# Patient Record
Sex: Female | Born: 1980 | Race: Black or African American | Hispanic: No | Marital: Married | Smoking: Never smoker
Health system: Southern US, Community
[De-identification: ages and names within clinical notes are randomized; demographics above are authoritative.]

## PROBLEM LIST (undated history)

## (undated) ENCOUNTER — Inpatient Hospital Stay (HOSPITAL_COMMUNITY): Payer: Self-pay

## (undated) DIAGNOSIS — D649 Anemia, unspecified: Secondary | ICD-10-CM

## (undated) DIAGNOSIS — Z9622 Myringotomy tube(s) status: Secondary | ICD-10-CM

## (undated) DIAGNOSIS — O0930 Supervision of pregnancy with insufficient antenatal care, unspecified trimester: Secondary | ICD-10-CM

## (undated) DIAGNOSIS — R87619 Unspecified abnormal cytological findings in specimens from cervix uteri: Secondary | ICD-10-CM

## (undated) DIAGNOSIS — O3410 Maternal care for benign tumor of corpus uteri, unspecified trimester: Secondary | ICD-10-CM

## (undated) DIAGNOSIS — D259 Leiomyoma of uterus, unspecified: Secondary | ICD-10-CM

## (undated) DIAGNOSIS — IMO0002 Reserved for concepts with insufficient information to code with codable children: Secondary | ICD-10-CM

## (undated) HISTORY — PX: COLPOSCOPY: SHX161

## (undated) HISTORY — DX: Unspecified abnormal cytological findings in specimens from cervix uteri: R87.619

## (undated) HISTORY — DX: Supervision of pregnancy with insufficient antenatal care, unspecified trimester: O09.30

## (undated) HISTORY — DX: Maternal care for benign tumor of corpus uteri, unspecified trimester: D25.9

## (undated) HISTORY — DX: Reserved for concepts with insufficient information to code with codable children: IMO0002

## (undated) HISTORY — DX: Anemia, unspecified: D64.9

## (undated) HISTORY — PX: DILATION AND CURETTAGE OF UTERUS: SHX78

## (undated) HISTORY — DX: Myringotomy tube(s) status: Z96.22

## (undated) HISTORY — PX: OTHER SURGICAL HISTORY: SHX169

## (undated) HISTORY — DX: Maternal care for benign tumor of corpus uteri, unspecified trimester: O34.10

---
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
## 2003-05-06 ENCOUNTER — Emergency Department (HOSPITAL_COMMUNITY): Admission: EM | Admit: 2003-05-06 | Discharge: 2003-05-06 | Payer: Self-pay | Admitting: Emergency Medicine

## 2003-07-26 ENCOUNTER — Emergency Department (HOSPITAL_COMMUNITY): Admission: EM | Admit: 2003-07-26 | Discharge: 2003-07-26 | Payer: Self-pay

## 2003-12-17 ENCOUNTER — Emergency Department (HOSPITAL_COMMUNITY): Admission: EM | Admit: 2003-12-17 | Discharge: 2003-12-17 | Payer: Self-pay | Admitting: *Deleted

## 2004-03-01 ENCOUNTER — Emergency Department (HOSPITAL_COMMUNITY): Admission: EM | Admit: 2004-03-01 | Discharge: 2004-03-02 | Payer: Self-pay | Admitting: *Deleted

## 2004-03-19 ENCOUNTER — Ambulatory Visit (HOSPITAL_COMMUNITY): Admission: RE | Admit: 2004-03-19 | Discharge: 2004-03-19 | Payer: Self-pay | Admitting: Obstetrics and Gynecology

## 2004-04-07 ENCOUNTER — Inpatient Hospital Stay (HOSPITAL_COMMUNITY): Admission: AD | Admit: 2004-04-07 | Discharge: 2004-04-07 | Payer: Self-pay | Admitting: *Deleted

## 2004-05-15 ENCOUNTER — Inpatient Hospital Stay (HOSPITAL_COMMUNITY): Admission: AD | Admit: 2004-05-15 | Discharge: 2004-05-15 | Payer: Self-pay | Admitting: Obstetrics and Gynecology

## 2004-06-18 ENCOUNTER — Inpatient Hospital Stay (HOSPITAL_COMMUNITY): Admission: AD | Admit: 2004-06-18 | Discharge: 2004-06-18 | Payer: Self-pay | Admitting: Family Medicine

## 2004-06-25 ENCOUNTER — Inpatient Hospital Stay (HOSPITAL_COMMUNITY): Admission: AD | Admit: 2004-06-25 | Discharge: 2004-06-25 | Payer: Self-pay | Admitting: Obstetrics and Gynecology

## 2004-07-12 ENCOUNTER — Emergency Department (HOSPITAL_COMMUNITY): Admission: EM | Admit: 2004-07-12 | Discharge: 2004-07-12 | Payer: Self-pay | Admitting: Emergency Medicine

## 2004-08-13 ENCOUNTER — Inpatient Hospital Stay (HOSPITAL_COMMUNITY): Admission: AD | Admit: 2004-08-13 | Discharge: 2004-08-13 | Payer: Self-pay | Admitting: Obstetrics & Gynecology

## 2004-08-24 ENCOUNTER — Ambulatory Visit: Payer: Self-pay | Admitting: Obstetrics and Gynecology

## 2004-08-28 ENCOUNTER — Ambulatory Visit: Payer: Self-pay | Admitting: Family Medicine

## 2004-08-28 ENCOUNTER — Inpatient Hospital Stay (HOSPITAL_COMMUNITY): Admission: AD | Admit: 2004-08-28 | Discharge: 2004-08-28 | Payer: Self-pay | Admitting: *Deleted

## 2004-08-29 ENCOUNTER — Inpatient Hospital Stay (HOSPITAL_COMMUNITY): Admission: AD | Admit: 2004-08-29 | Discharge: 2004-08-31 | Payer: Self-pay | Admitting: *Deleted

## 2005-01-11 ENCOUNTER — Inpatient Hospital Stay (HOSPITAL_COMMUNITY): Admission: AD | Admit: 2005-01-11 | Discharge: 2005-01-11 | Payer: Self-pay | Admitting: *Deleted

## 2005-04-26 ENCOUNTER — Emergency Department (HOSPITAL_COMMUNITY): Admission: EM | Admit: 2005-04-26 | Discharge: 2005-04-26 | Payer: Self-pay | Admitting: Emergency Medicine

## 2005-05-01 ENCOUNTER — Emergency Department (HOSPITAL_COMMUNITY): Admission: EM | Admit: 2005-05-01 | Discharge: 2005-05-01 | Payer: Self-pay | Admitting: Emergency Medicine

## 2005-06-15 IMAGING — US US PELVIS COMPLETE MODIFY
1 series · 14 of 25 positions shown · non-contrast
Comparison: none

CLINICAL DATA: Low abdominal and pelvis pain. 
 PELVIC AND TRANSVAGINAL ULTRASOUND

[Series 1: 02 · 0.27mm/px · 14 of 40 slices shown]
[im 1/40]
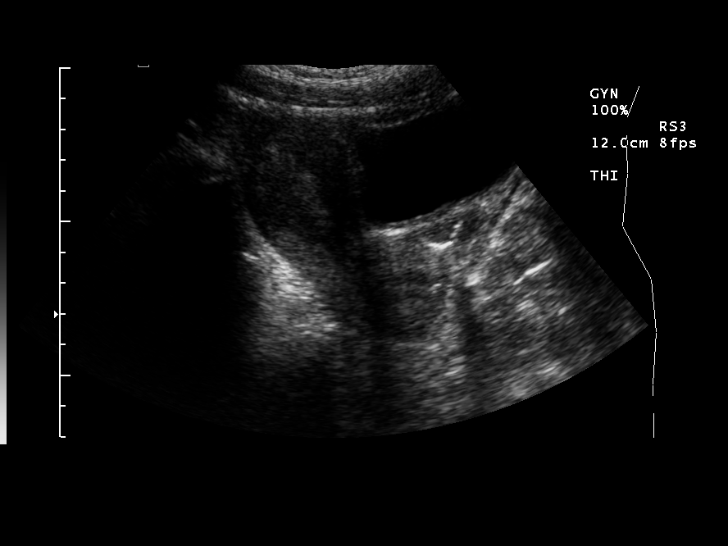
[im 4/40]
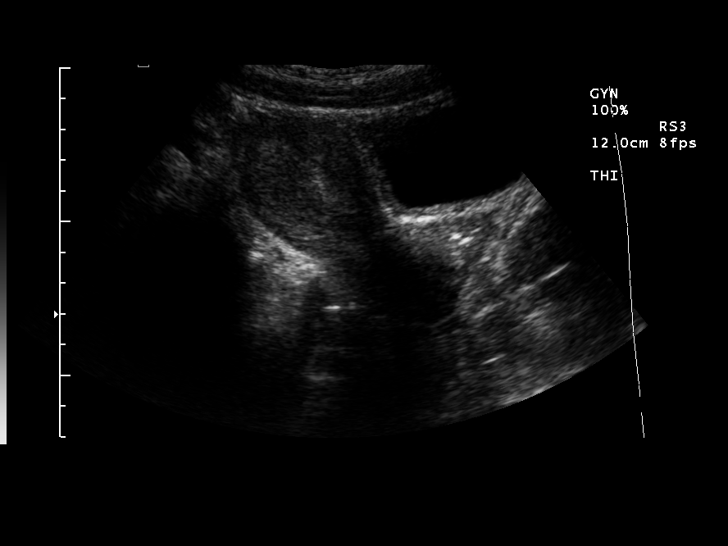
[im 7/40]
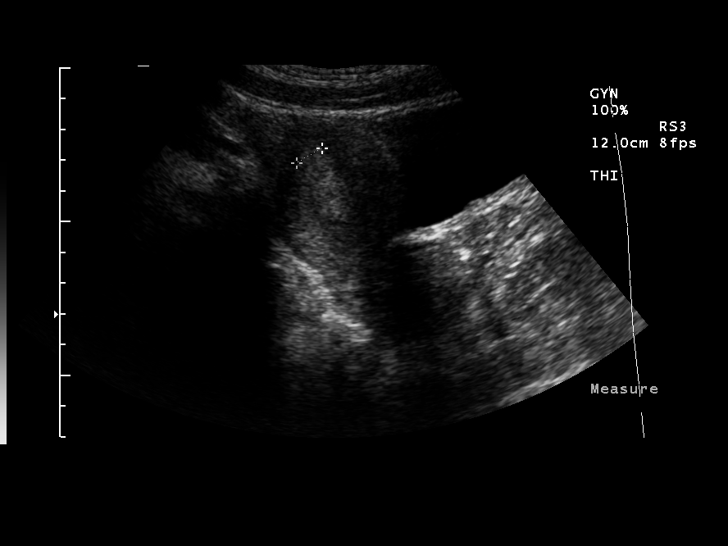
[im 10/40]
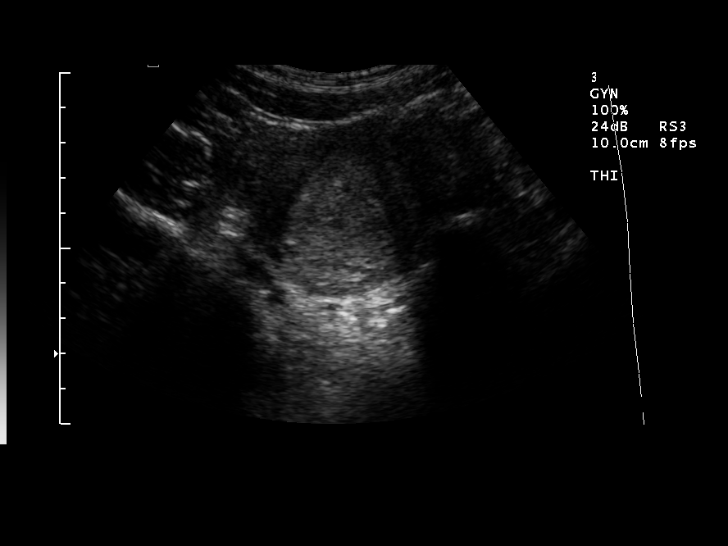
[im 14/40]
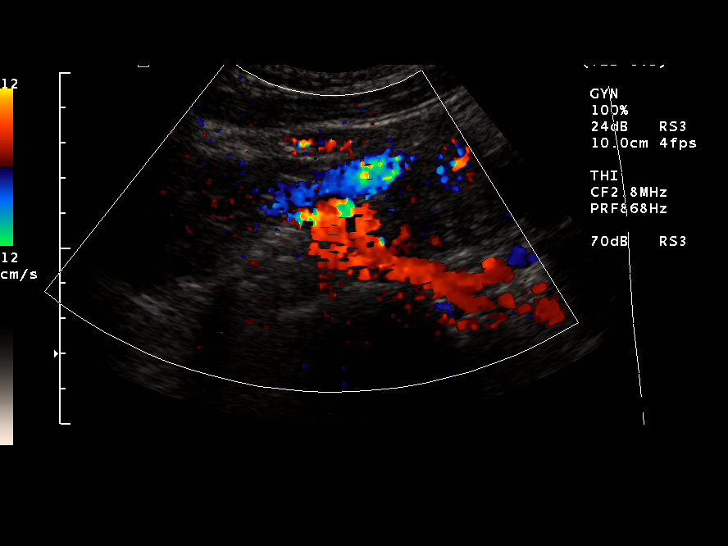
[im 15/40]
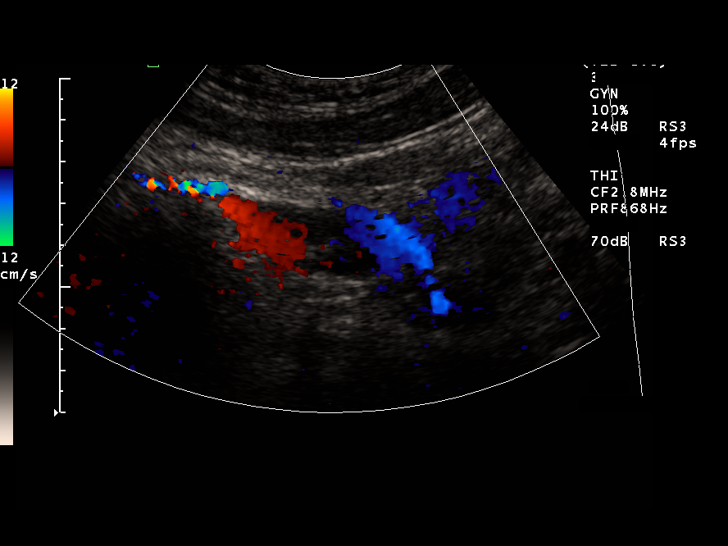
[im 18/40]
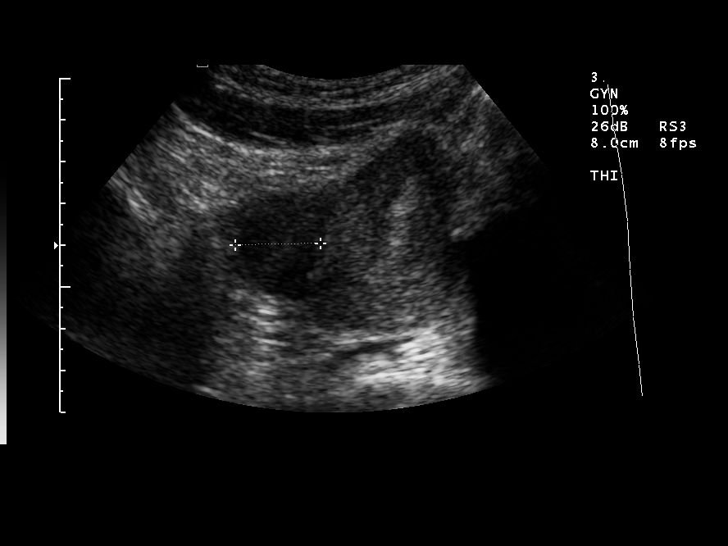
[im 22/40]
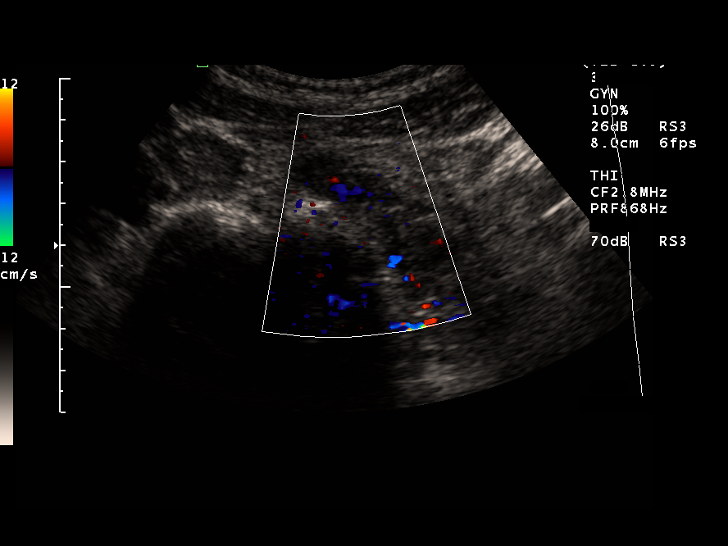
[im 25/40]
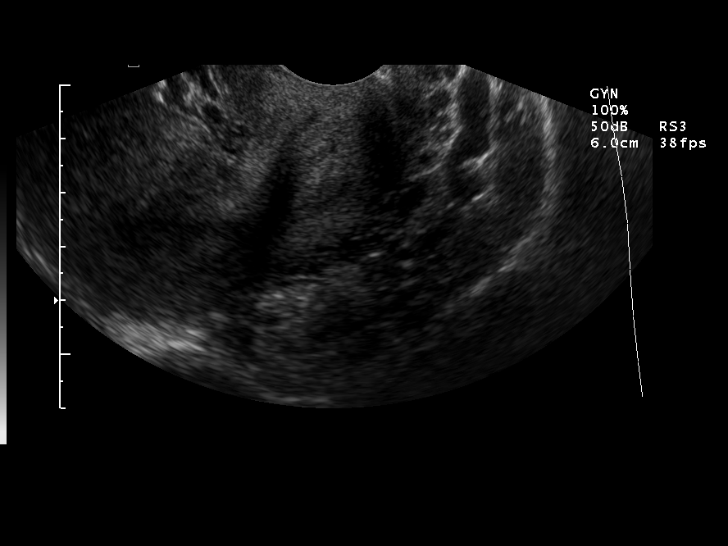
[im 27/40]
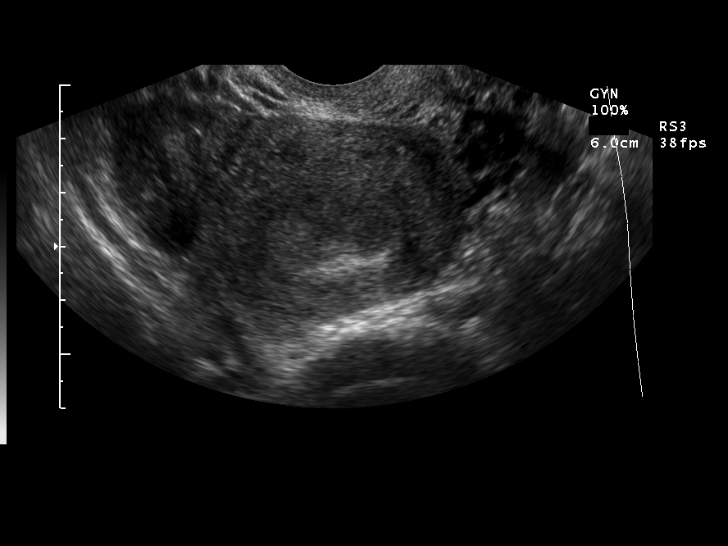
[im 30/40]
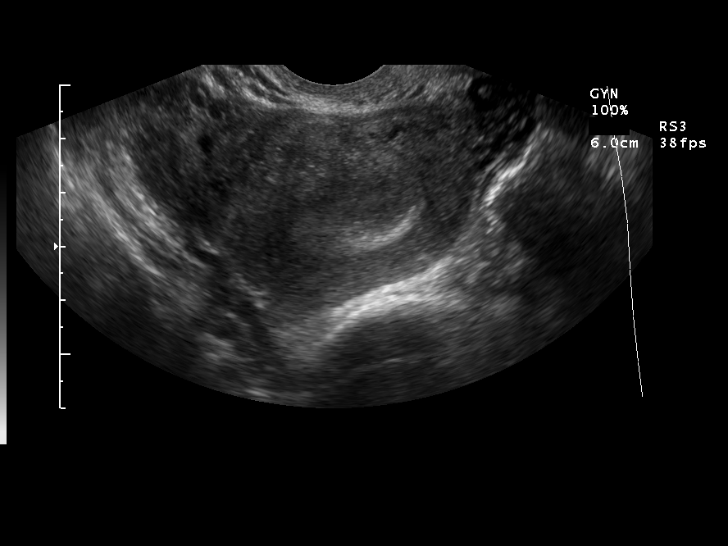
[im 33/40]
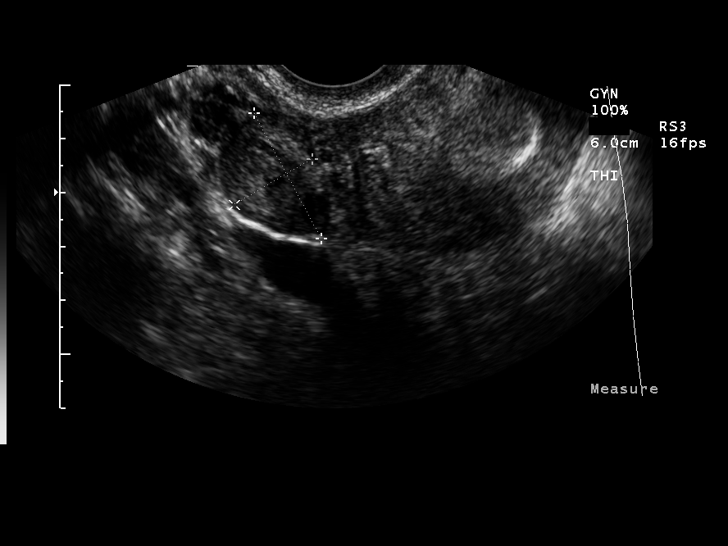
[im 36/40]
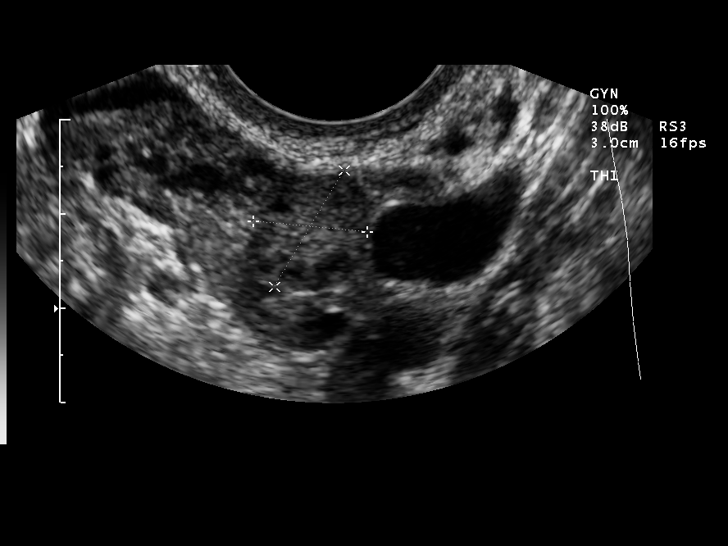
[im 40/40]
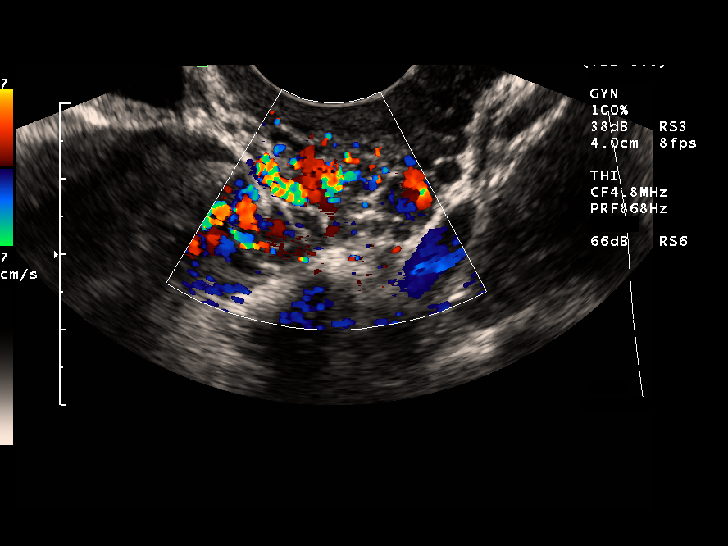

[14 of 25 positions shown; findings below may reference images not displayed]

FINDINGS: Uterus measures approximately 3.6 X 5.3 X 8.4 cm with a prominent endometrial strip measuring up to 9.0 mm in total thickness.  Right ovary measures approximately 20.0 X 24.0 X 32.0 mm and is unremarkable.  The left ovary measures approximately 16.0 X 24.0 X 28.0 mm and is unremarkable.  No free fluid or adnexal mass is evident.  There are prominent vascular channels around the left ovary.  Correlate with any clinical evidence of pelvic congestion syndrome.  
 IMPRESSION
 Unremarkable uterus and ovaries.  
 Prominent vascular channels in the left adnexal region.  Correlate with clinical evidence of pelvic congestion syndrome.

## 2005-08-30 IMAGING — US US OB COMP +14 WK
1 series · 14 of 14 positions shown · non-contrast
Comparison: none

CLINICAL DATA: Abdominal pain. 
 SECOND TRIMESTER OB ULTRASOUND 
 Comparing 12/17/03.

[Series 1: ob · 0.32mm/px · 14 of 14 slices shown]
[im 1/14]
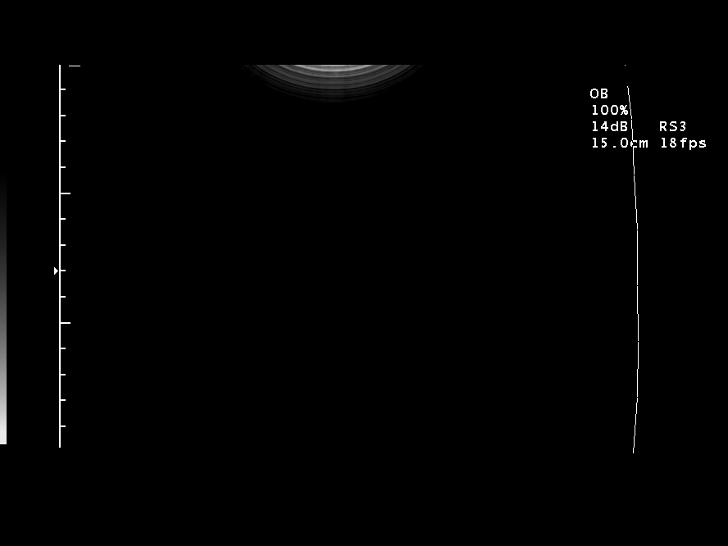
[im 2/14]
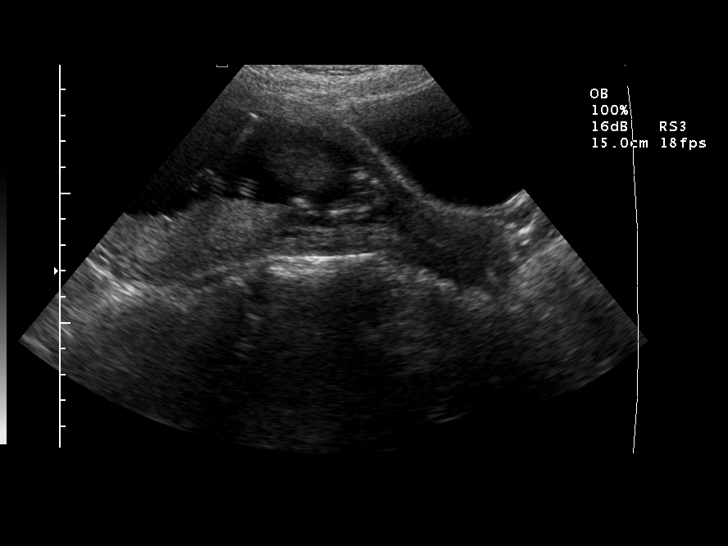
[im 3/14]
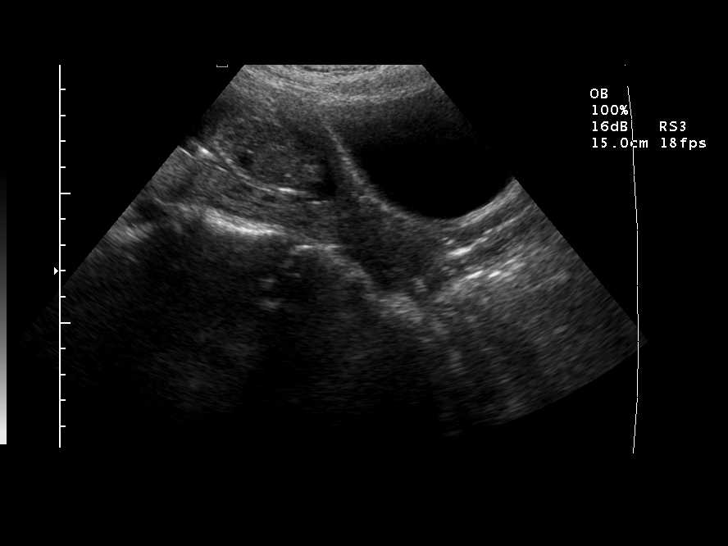
[im 4/14]
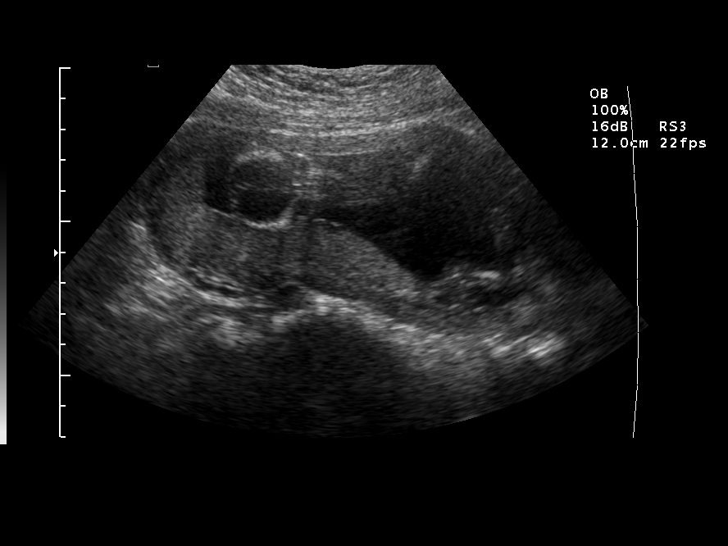
[im 5/14]
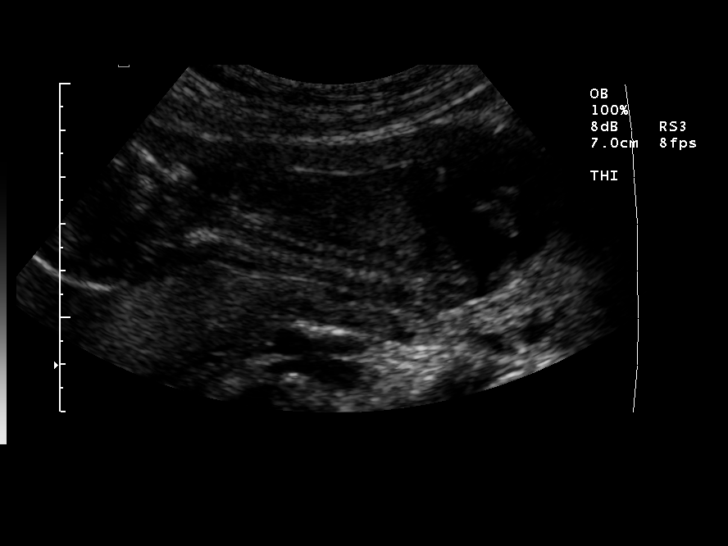
[im 6/14]
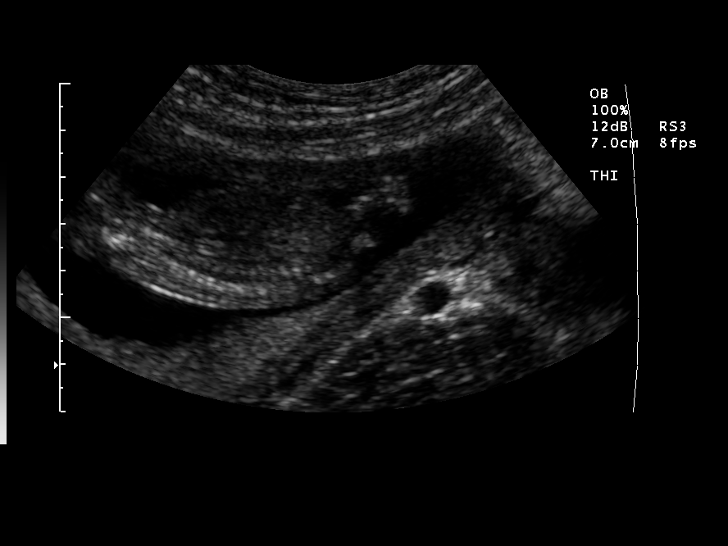
[im 7/14]
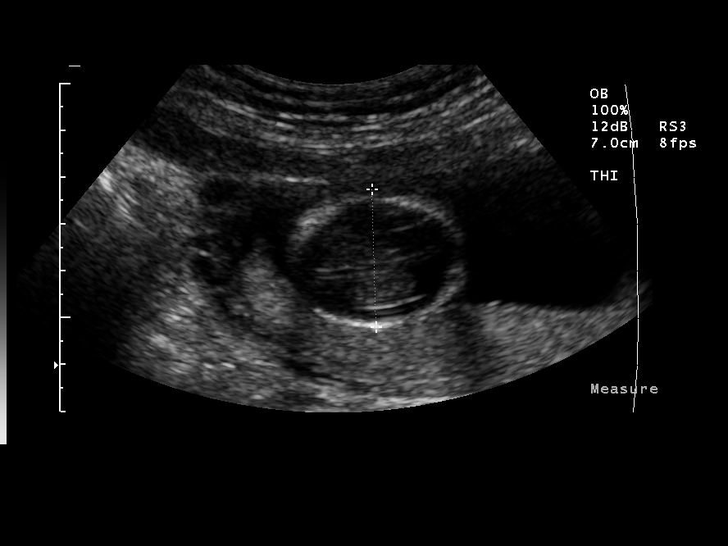
[im 8/14]
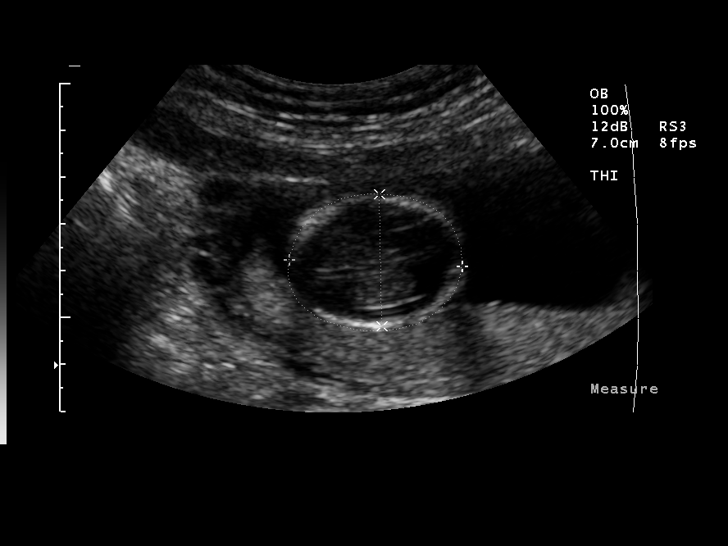
[im 9/14]
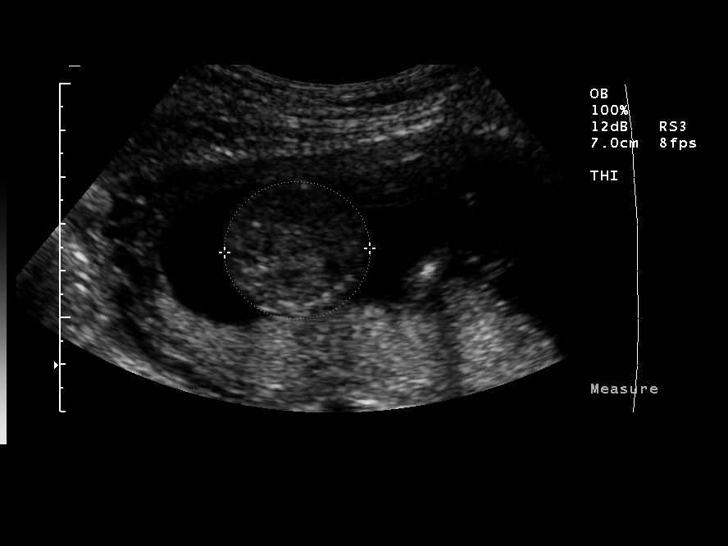
[im 10/14]
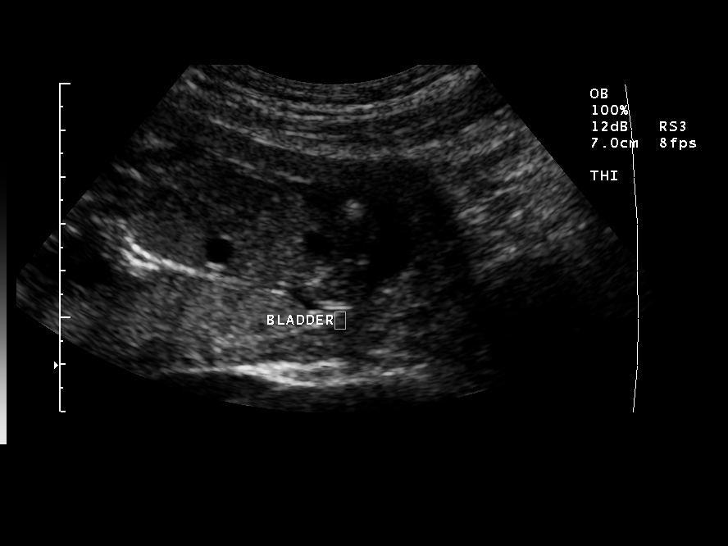
[im 11/14]
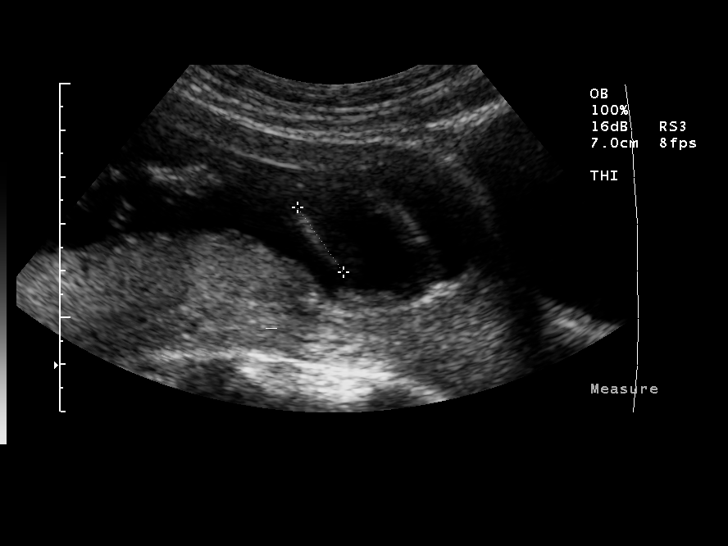
[im 12/14]
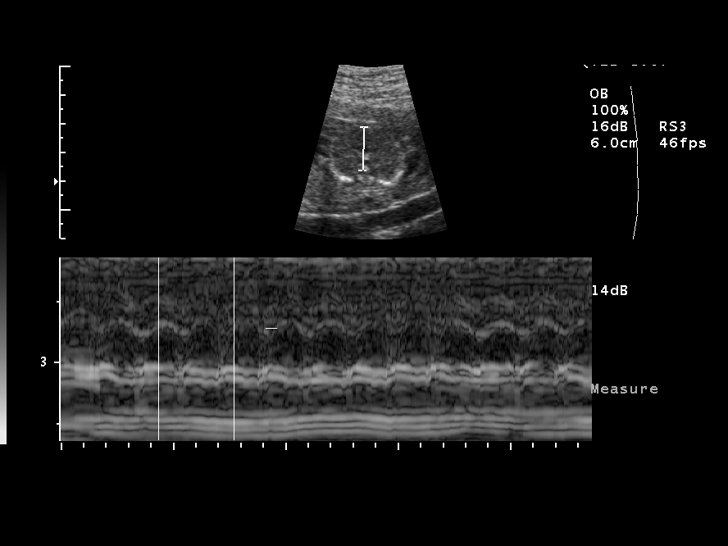
[im 13/14]
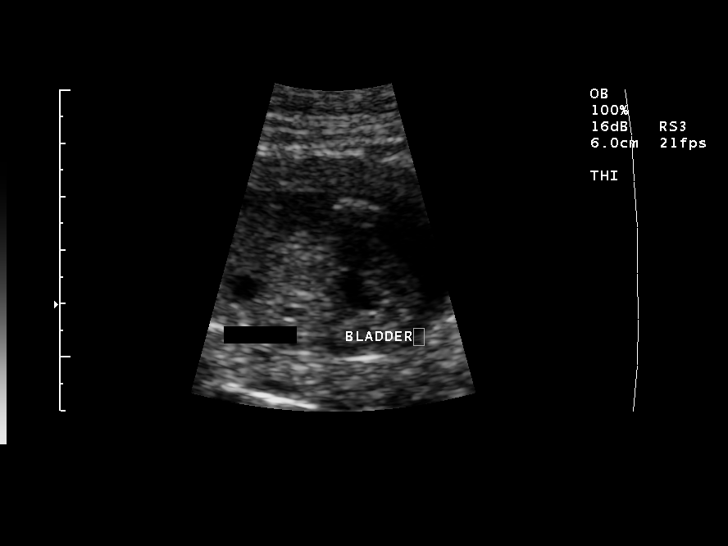
[im 14/14]
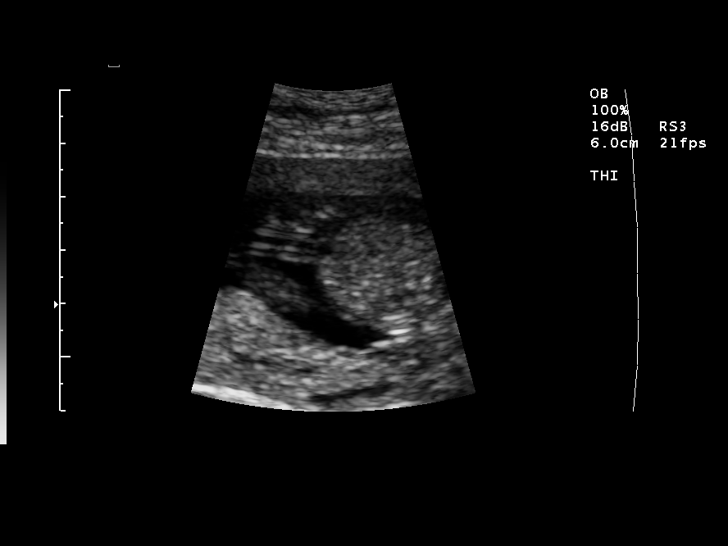

[14 of 14 positions shown; findings below may reference images not displayed]

FINDINGS: There is a single living intrauterine pregnancy with spontaneous fetal motion and fetal cardiac activity at 179 beats per minute.  Subjectively the amount of fluid in the gestational sac is at the lower limits of the normal range.  Fetal growth measurements are as follows:
 BPD:  28.1 cm, 15 weeks, 0 days.
 Head circumference:  10.8 cm, 15 weeks, 1 day. 
 Abdominal circumference:  9.4 cm, 15 weeks, 4 days.
 Femur length:  16.9 cm, 15 weeks, 0 days. 
 The placenta is posterior.  Fetal anatomic survey was not performed due to early gestational age. 
 The ovaries are not visualized. 
 IMPRESSION
 1.  Single living intrauterine pregnancy measuring at 15 weeks 1 day gestation.

## 2005-10-14 ENCOUNTER — Emergency Department (HOSPITAL_COMMUNITY): Admission: EM | Admit: 2005-10-14 | Discharge: 2005-10-14 | Payer: Self-pay | Admitting: Family Medicine

## 2005-12-02 ENCOUNTER — Emergency Department (HOSPITAL_COMMUNITY): Admission: EM | Admit: 2005-12-02 | Discharge: 2005-12-02 | Payer: Self-pay | Admitting: Family Medicine

## 2006-08-11 ENCOUNTER — Emergency Department (HOSPITAL_COMMUNITY): Admission: EM | Admit: 2006-08-11 | Discharge: 2006-08-11 | Payer: Self-pay | Admitting: Family Medicine

## 2007-02-17 ENCOUNTER — Emergency Department (HOSPITAL_COMMUNITY): Admission: EM | Admit: 2007-02-17 | Discharge: 2007-02-17 | Payer: Self-pay | Admitting: Family Medicine

## 2007-03-23 ENCOUNTER — Emergency Department (HOSPITAL_COMMUNITY): Admission: EM | Admit: 2007-03-23 | Discharge: 2007-03-23 | Payer: Self-pay | Admitting: Family Medicine

## 2007-04-06 ENCOUNTER — Emergency Department (HOSPITAL_COMMUNITY): Admission: EM | Admit: 2007-04-06 | Discharge: 2007-04-06 | Payer: Self-pay | Admitting: Family Medicine

## 2007-05-21 ENCOUNTER — Emergency Department (HOSPITAL_COMMUNITY): Admission: EM | Admit: 2007-05-21 | Discharge: 2007-05-21 | Payer: Self-pay | Admitting: Family Medicine

## 2007-07-12 ENCOUNTER — Emergency Department (HOSPITAL_COMMUNITY): Admission: EM | Admit: 2007-07-12 | Discharge: 2007-07-12 | Payer: Self-pay | Admitting: Family Medicine

## 2008-02-09 ENCOUNTER — Emergency Department (HOSPITAL_COMMUNITY): Admission: EM | Admit: 2008-02-09 | Discharge: 2008-02-09 | Payer: Self-pay | Admitting: Emergency Medicine

## 2008-05-07 ENCOUNTER — Emergency Department (HOSPITAL_COMMUNITY): Admission: EM | Admit: 2008-05-07 | Discharge: 2008-05-07 | Payer: Self-pay | Admitting: Family Medicine

## 2008-11-01 ENCOUNTER — Emergency Department (HOSPITAL_COMMUNITY): Admission: EM | Admit: 2008-11-01 | Discharge: 2008-11-01 | Payer: Self-pay | Admitting: Emergency Medicine

## 2008-11-02 ENCOUNTER — Emergency Department (HOSPITAL_COMMUNITY): Admission: EM | Admit: 2008-11-02 | Discharge: 2008-11-02 | Payer: Self-pay | Admitting: Family Medicine

## 2009-07-23 ENCOUNTER — Emergency Department (HOSPITAL_COMMUNITY): Admission: EM | Admit: 2009-07-23 | Discharge: 2009-07-23 | Payer: Self-pay | Admitting: Emergency Medicine

## 2009-10-16 ENCOUNTER — Emergency Department (HOSPITAL_COMMUNITY): Admission: EM | Admit: 2009-10-16 | Discharge: 2009-10-16 | Payer: Self-pay | Admitting: Family Medicine

## 2010-05-28 ENCOUNTER — Emergency Department (HOSPITAL_COMMUNITY)
Admission: EM | Admit: 2010-05-28 | Discharge: 2010-05-28 | Payer: Self-pay | Source: Home / Self Care | Admitting: Emergency Medicine

## 2010-11-05 ENCOUNTER — Emergency Department (HOSPITAL_COMMUNITY)
Admission: EM | Admit: 2010-11-05 | Discharge: 2010-11-05 | Payer: Self-pay | Source: Home / Self Care | Admitting: Emergency Medicine

## 2010-11-05 ENCOUNTER — Inpatient Hospital Stay (HOSPITAL_COMMUNITY)
Admission: AD | Admit: 2010-11-05 | Discharge: 2010-11-06 | Payer: Self-pay | Attending: Obstetrics and Gynecology | Admitting: Obstetrics and Gynecology

## 2010-11-05 LAB — POCT I-STAT, CHEM 8
BUN: 5 mg/dL — ABNORMAL LOW (ref 6–23)
Calcium, Ion: 1.2 mmol/L (ref 1.12–1.32)
Chloride: 104 mEq/L (ref 96–112)
HCT: 32 % — ABNORMAL LOW (ref 36.0–46.0)
Sodium: 138 mEq/L (ref 135–145)

## 2010-11-05 LAB — URINALYSIS, ROUTINE W REFLEX MICROSCOPIC
Bilirubin Urine: NEGATIVE
Ketones, ur: 15 mg/dL — AB
Nitrite: NEGATIVE
Specific Gravity, Urine: 1.033 — ABNORMAL HIGH (ref 1.005–1.030)
Urobilinogen, UA: 0.2 mg/dL (ref 0.0–1.0)

## 2010-11-06 LAB — CBC
MCHC: 32.5 g/dL (ref 30.0–36.0)
MCV: 83.6 fL (ref 78.0–100.0)
Platelets: 268 10*3/uL (ref 150–400)
RDW: 14.1 % (ref 11.5–15.5)
WBC: 5.9 10*3/uL (ref 4.0–10.5)

## 2010-11-16 NOTE — Consult Note (Signed)
[name], [name]              ACCOUNT NO.:  [id number]  MEDICAL RECORD NO.:  [id number]          PATIENT TYPE:  MAT  LOCATION:  [facility]                          FACILITY:  [facility]  PHYSICIAN:  Blayke Cordrey H. Tenny Craw, MD     DATE OF BIRTH:  1981/05/01  DATE OF CONSULTATION: DATE OF DISCHARGE:  11/06/2010                                CONSULTATION   CHIEF COMPLAINT:  Vaginal bleeding.  HISTORY OF PRESENT ILLNESS:  [name] is a 30 year old G2 P1-0-1-1 who is status post an elective pregnancy termination on Friday who was having light vaginal bleeding over the weekend with changing the pad approximately 2 times a day until about 1 o'clock in the afternoon on January 30 when she began to experience an increase in her vaginal bleeding with passage of clots and cramping.  She denies any fevers, chills, nausea, or vomiting.  PAST MEDICAL HISTORY:  None.  PAST SURGICAL HISTORY:  Bilateral ear tubes.  PAST OB/GYN:  G2 P1-0-1-1.  She had a previous full term spontaneous vaginal delivery.  She had an 11-week elective pregnancy terminations at [facility] on Friday, the 27th.  She was using NuvaRing for contraception and became pregnant on NuvaRing.  SOCIAL HISTORY:  Negative x3.  PHYSICAL EXAMINATION:  GENERAL:  Alert and oriented x3 in no apparent distress. VITAL SIGNS:  Temperature 98.5, blood pressure 104/61, heart rate 84, respirations 17. ABDOMEN:  Soft, nontender, nondistended, no rebound or guarding. GENITOURINARY:  Normal external female genitalia.  There is a small amount of pooled blood in the vaginal vault.  No active bleeding. Cervix is slightly tender to palpation, approximately a centimeter dilated.  Uterus is mobile and small, minimally tender.  Adnexa nontender, nonpalpable bilaterally.  White blood cell count 5.9, hemoglobin 8.6, platelets 268,000.  Blood type is O+.  Urine analysis is negative.  Quantitative HCG is 2567. Pelvic ultrasound, uterus is 11.7 x  6.6 x 10.2 cm with a left fundal fibroid measuring 2.5 x 2.6 x 2.4 cm.  Endometrium contains a complex heterogeneous material measuring approximately 3-cm in thickness.  While this could represent clot, there is also possibility of retained products of conception.  Ovaries are normal bilaterally.  No free fluid noted.  ASSESSMENT AND PLAN:  This is a 30 year old G2 P1-0-1-1 now 3 days status post elective pregnancy termination with either retained products of conception versus retained blood clot.  Management options were discussed with the patient.  Repeat uterine evacuation versus misoprostol were discussed.  Risks of bleeding, infection, which can occur with both procedures were reviewed.  Following careful consideration of risks, benefits, and alternatives, the patient elected to attempt conservative medical management with vaginal misoprostol.  An 800 mcg of misoprostol will be placed in the vagina x1.  The patient can expect heavy bleeding and cramping with some passage of clots.  If she experiences excess bleeding, she should return to the emergency room for evaluation.  She will follow up in the office on Thursday for evaluation, will continue with doxycycline for a total of 10 days.  We will also send the patient home with prescription for Vicodin for cramping.  Freddrick March. Tenny Craw, MD     KHR/MEDQ  D:  11/06/2010  T:  11/06/2010  Job:  [id number]  Electronically Signed by Waynard Reeds MD on 11/16/2010 12:42:06 PM

## 2010-12-20 LAB — URINALYSIS, ROUTINE W REFLEX MICROSCOPIC
Bilirubin Urine: NEGATIVE
Glucose, UA: NEGATIVE mg/dL
Ketones, ur: 15 mg/dL — AB
Protein, ur: NEGATIVE mg/dL
Specific Gravity, Urine: 1.022 (ref 1.005–1.030)

## 2010-12-20 LAB — WET PREP, GENITAL
Trich, Wet Prep: NONE SEEN
Yeast Wet Prep HPF POC: NONE SEEN

## 2010-12-20 LAB — POCT PREGNANCY, URINE: Preg Test, Ur: NEGATIVE

## 2010-12-26 LAB — ABO/RH: ABO/RH(D): O POS

## 2010-12-26 LAB — WET PREP, GENITAL: Trich, Wet Prep: NONE SEEN

## 2011-02-22 NOTE — Discharge Summary (Signed)
[facility]  Patient:    Jean Cabrera, Jean Cabrera                       MRN: [id number] Adm. Date:  [id number] Disc. Date: [id number] Attending:  Leonard Schwartz Dictator:   Pricilla Holm, M.D.                           Discharge Summary  CONSULTS:                     None.  PROCEDURES:                   None.  HOSPITAL COURSE:              The patient is a 30 year old female who presented with a four-day history of right-sided flank pain, fever, and dysuria.  She states that on Sunday, August 12, she started to have this right-sided pain, started to feel a little bit of nausea and vomiting and felt very febrile.  She states that she also had just ended her period and had recently started taking new birth control pill.  She says that she had not had a bowel movement in approximately a week and continued to be unable to have a bowel movement.  She was admitted to a private physician and then transferred to the [facility] teaching service for further workup.  In her lab work, pregnancy test was negative.  White blood cell count 11, hemoglobin 12.3, hematocrit 3.8, platelets 339, segs 79, lymphocytes 11.  CG and chlamydia negative.  Hepatitis B surface antigen negative.  Wet prep showed moderate bacteria.  Urine showed ketones 15, protein 30, positive nitrites, moderate leukocyte esterase, many bacteria.  ALT 62, AST 70, alkaline phosphatase 130.  The rest of her labs were within normal limits. She did not have any adnexal tenderness nor right lower quadrant pain nor right upper quadrant pain.  It was found that most probably she had pyelonephritis.  She had noted that her boyfriends mother had given her several Vioxx to take for the pain.  This is the most likely cause for her slightly elevated liver enzymes.  Cultures and sensitivities of her urine came back with greater than 100,000 E. coli.  The patient had been on Ancef, switched to IV Unasyn, and was  now changed to p.o. Bactrim for a total of 12 days.  On the morning of discharge, May 23, 2000, the patient was doing well without fever and without CVA tenderness.  She was remarkably better, and it was deemed appropriate to send her home.  DISCHARGE CONDITION:          Good.  DISPOSITION:                  Discharged to home with family.  DISCHARGE MEDICATIONS:        The patient was given a prescription for Bactrim, a 12-day course, 1 tablet p.o. b.i.d. x 12 days.  DISCHARGE INSTRUCTIONS:       The patient was given instructions to complete the full course of antibiotics and to return to her primary care doctor if she does not feel like that she is improving or feels that her course is worsening instead of improving over the next several days.  The patient will follow up in approximately two weeks with her primary care doctor.  The patient voiced understanding of the  above plan and had no further questions. DD:  05/23/00 TD:  05/26/00 Job: [id number]

## 2011-04-09 ENCOUNTER — Inpatient Hospital Stay (INDEPENDENT_AMBULATORY_CARE_PROVIDER_SITE_OTHER)
Admission: RE | Admit: 2011-04-09 | Discharge: 2011-04-09 | Disposition: A | Discharge status: Home / Self Care | Payer: Self-pay | Source: Ambulatory Visit | Attending: Family Medicine | Admitting: Family Medicine

## 2011-04-09 DIAGNOSIS — L02519 Cutaneous abscess of unspecified hand: Secondary | ICD-10-CM

## 2011-04-13 LAB — CULTURE, ROUTINE-ABSCESS
Culture: NO GROWTH
Gram Stain: NONE SEEN

## 2011-07-05 LAB — POCT URINALYSIS DIP (DEVICE)
Glucose, UA: NEGATIVE
Ketones, ur: NEGATIVE
Nitrite: POSITIVE — AB
Operator id: 30335
Protein, ur: >=300 — AB
Specific Gravity, Urine: 1.015
pH: 6.5

## 2011-07-05 LAB — URINE CULTURE: Colony Count: >=100000

## 2011-07-05 LAB — POCT PREGNANCY, URINE: Preg Test, Ur: NEGATIVE

## 2011-07-24 LAB — HEPATIC FUNCTION PANEL
ALT: 17
AST: 18
Albumin: 3.8
Alkaline Phosphatase: 83
Bilirubin, Direct: 0.1
Indirect Bilirubin: 0.4
Total Bilirubin: 0.5
Total Protein: 7.1

## 2011-07-24 LAB — DIFFERENTIAL
Basophils Absolute: 0
Basophils Relative: 0
Eosinophils Absolute: 0.1
Eosinophils Relative: 2
Lymphocytes Relative: 24
Lymphs Abs: 1.4
Monocytes Absolute: 0.4
Monocytes Relative: 7
Neutro Abs: 3.9
Neutrophils Relative %: 66

## 2011-07-24 LAB — I-STAT 8, (EC8 V) (CONVERTED LAB)
Acid-base deficit: 3 — ABNORMAL HIGH
BUN: 11
Bicarbonate: 22.7
Chloride: 105
Glucose, Bld: 91
HCT: 45
Hemoglobin: 15.3 — ABNORMAL HIGH
Operator id: 235561
Potassium: 3.8
Sodium: 139
TCO2: 24
pCO2, Ven: 40.1 — ABNORMAL LOW
pH, Ven: 7.36 — ABNORMAL HIGH

## 2011-07-24 LAB — T4, FREE: Free T4: 1

## 2011-07-24 LAB — SEDIMENTATION RATE: Sed Rate: 4

## 2011-07-24 LAB — RHEUMATOID FACTOR: Rhuematoid fact SerPl-aCnc: <20

## 2011-07-24 LAB — POCT I-STAT CREATININE
Creatinine, Ser: 0.8
Operator id: 235561

## 2011-07-24 LAB — TSH: TSH: 1.022

## 2011-07-24 LAB — ROCKY MTN SPOTTED FVR AB, IGM-BLOOD: RMSF IgM: 0.25

## 2011-07-24 LAB — CULTURE, ROUTINE-ABSCESS: Gram Stain: NONE SEEN

## 2011-07-24 LAB — CBC
HCT: 37.9
Hemoglobin: 12.5
MCHC: 32.8
MCV: 80.6
Platelets: 374
RBC: 4.71
RDW: 14.1 — ABNORMAL HIGH
WBC: 5.9

## 2011-07-24 LAB — ROCKY MTN SPOTTED FVR AB, IGG-BLOOD: RMSF IgG: 0.08 {ISR}

## 2011-07-24 LAB — POCT INFECTIOUS MONO SCREEN: Mono Screen: NEGATIVE

## 2011-07-24 LAB — RPR: RPR Ser Ql: NONREACTIVE

## 2011-08-16 ENCOUNTER — Emergency Department (HOSPITAL_COMMUNITY): Payer: Medicaid Other

## 2011-08-16 ENCOUNTER — Emergency Department (HOSPITAL_COMMUNITY)
Admission: EM | Admit: 2011-08-16 | Discharge: 2011-08-16 | Disposition: A | Discharge status: Home / Self Care | Payer: Medicaid Other | Attending: Emergency Medicine | Admitting: Emergency Medicine

## 2011-08-16 ENCOUNTER — Encounter: Payer: Self-pay | Admitting: *Deleted

## 2011-08-16 DIAGNOSIS — M25473 Effusion, unspecified ankle: Secondary | ICD-10-CM | POA: Insufficient documentation

## 2011-08-16 DIAGNOSIS — S93409A Sprain of unspecified ligament of unspecified ankle, initial encounter: Secondary | ICD-10-CM | POA: Insufficient documentation

## 2011-08-16 DIAGNOSIS — X500XXA Overexertion from strenuous movement or load, initial encounter: Secondary | ICD-10-CM | POA: Insufficient documentation

## 2011-08-16 DIAGNOSIS — S93402A Sprain of unspecified ligament of left ankle, initial encounter: Secondary | ICD-10-CM

## 2011-08-16 DIAGNOSIS — M25476 Effusion, unspecified foot: Secondary | ICD-10-CM | POA: Insufficient documentation

## 2011-08-16 DIAGNOSIS — M25579 Pain in unspecified ankle and joints of unspecified foot: Secondary | ICD-10-CM | POA: Insufficient documentation

## 2011-08-16 MED ORDER — HYDROCODONE-ACETAMINOPHEN 5-325 MG PO TABS
1.0000 | ORAL_TABLET | Freq: Once | ORAL | Status: AC
  Administered 2011-08-16: 1 via ORAL
  Filled 2011-08-16: qty 1

## 2011-08-16 MED ORDER — TRAMADOL HCL 50 MG PO TABS: 50.0000 mg | ORAL_TABLET | Freq: Four times a day (QID) | ORAL | Status: AC | PRN

## 2011-08-16 MED ORDER — NAPROXEN 500 MG PO TABS: 500.0000 mg | ORAL_TABLET | Freq: Two times a day (BID) | ORAL | Status: DC

## 2011-08-16 NOTE — Progress Notes (Signed)
Orthopedic Tech Progress Note Patient Details:  Jean Cabrera 29-Jul-1981 [id number]  Other Ortho Devices Type of Ortho Device: ASO;Crutches Ortho Device Location: (L) LE Ortho Device Interventions: Application   [name] 08/16/2011, 11:15 PM

## 2011-08-16 NOTE — ED Provider Notes (Signed)
History     CSN: [id number] Arrival date & time: 08/16/2011  7:28 PM   First MD Initiated Contact with Patient 08/16/11 2246      Chief Complaint  Patient presents with  . Ankle Pain    (Consider location/radiation/quality/duration/timing/severity/associated sxs/prior treatment) Patient is a 30 y.o. female presenting with ankle pain. The history is provided by the patient.  Ankle Pain  The incident occurred 3 to 5 hours ago. The injury mechanism was a fall. The pain is present in the left ankle. The quality of the pain is described as aching. The pain is at a severity of 7/10. The pain is moderate. The pain has been constant since onset. Associated symptoms include inability to bear weight and loss of motion. Pertinent negatives include no numbness, no loss of sensation and no tingling. She reports no foreign bodies present. The symptoms are aggravated by activity, bearing weight and palpation. She has tried nothing for the symptoms.  Pt reports stepping in a hole and twisting her ankle. States she felt a pop. Denies any other injuries. Unable to bear weight on that leg.  History reviewed. No pertinent past medical history.  History reviewed. No pertinent past surgical history.  History reviewed. No pertinent family history.  History  Substance Use Topics  . Smoking status: Never Smoker   . Smokeless tobacco: Never Used  . Alcohol Use: Yes    OB History    Grav Para Term Preterm Abortions TAB SAB Ect Mult Living                  Review of Systems  Neurological: Negative for tingling and numbness.  All other systems reviewed and are negative.    Allergies  Review of patient's allergies indicates no known allergies.  Home Medications   Current Outpatient Rx  Name Route Sig Dispense Refill  . RISAQUAD PO CAPS Oral Take 1 capsule by mouth 2 (two) times daily.      . OMEGA-3 FATTY ACIDS 1000 MG PO CAPS Oral Take 1 g by mouth daily.      . IBUPROFEN 200 MG PO TABS Oral  Take 400 mg by mouth every 6 (six) hours as needed. For pain     . THERA M PLUS PO TABS Oral Take 1 tablet by mouth daily.        BP 127/82  Pulse 83  Temp(Src) 98.7 F (37.1 C) (Oral)  Resp 18  SpO2 98%  Physical Exam  Nursing note and vitals reviewed. Constitutional: She is oriented to person, place, and time. She appears well-developed and well-nourished.  HENT:  Head: Normocephalic and atraumatic.  Neck: Neck supple.  Cardiovascular: Normal rate and regular rhythm.   Pulmonary/Chest: Effort normal and breath sounds normal. No respiratory distress.  Musculoskeletal: She exhibits tenderness.       Swelling noted over lateral malleolus of left ankle. Tenderness with palpation over left ankle. Pain with ankle dorsiflexion, plantarflexion, inversion, eversion. Normal foot and knee exam.  Neurological: She is alert and oriented to person, place, and time. She has normal reflexes.  Skin: Skin is warm and dry.    ED Course  Procedures (including critical care time)  Labs Reviewed - No data to display Dg Ankle Complete Left  08/16/2011  *RADIOLOGY REPORT*  Clinical Data: Twisted left ankle.  LEFT ANKLE COMPLETE - 3+ VIEW  Comparison: None  Findings: The ankle mortise is maintained.  No acute ankle fracture.  No osteochondral abnormality.  The subtalar joints are maintained.  A small calcaneal heel spur is noted.  IMPRESSION: No acute bony findings.  Original Report Authenticated By: P. Loralie Champagne, M.D.     Negative x-ray. ASO  And crutches provided by Ortho Tech. Will d/c home with orthopedics follow up.   MDM          Lottie Mussel, PA 08/16/11 2306

## 2011-08-16 NOTE — ED Notes (Signed)
The pt fell in a hole she has medially and lateral swelling to the lt ankle.  Good pedal pulse

## 2011-08-16 NOTE — ED Notes (Signed)
Pt reports she stepped in hole just prior to arrival and felt left ankle pop, reports can not feel or wiggle toes. Deformity and swelling noted. pts cap refill less than 2. Pulses present.

## 2011-08-17 NOTE — ED Provider Notes (Signed)
Medical screening examination/treatment/procedure(s) were performed by non-physician practitioner and as supervising physician I was immediately available for consultation/collaboration.  Flint Melter, MD 08/17/11 [phone]

## 2011-10-08 NOTE — L&D Delivery Note (Addendum)
Delivery Note At 4:10 PM a viable and healthy female was delivered via Vaginal, Spontaneous Delivery (Presentation: Left Occiput Anterior).  APGAR: 6, 9; weight 6 lbs. 14 oz.   Placenta status: Intact, Spontaneous.  Cord: 3 vessels with single loose nuchal cord.  Anesthesia: Epidural  Episiotomy: None Lacerations: None Est. Blood Loss (mL): 400  Mom to postpartum.  Baby to nursery-stable.  Mikya Don D 06/09/2012, 5:18 PM

## 2011-12-04 LAB — OB RESULTS CONSOLE RPR: RPR: NONREACTIVE

## 2011-12-04 LAB — OB RESULTS CONSOLE HEPATITIS B SURFACE ANTIGEN: Hepatitis B Surface Ag: NEGATIVE

## 2011-12-04 LAB — OB RESULTS CONSOLE ABO/RH: RH Type: POSITIVE

## 2011-12-04 LAB — OB RESULTS CONSOLE HIV ANTIBODY (ROUTINE TESTING): HIV: NONREACTIVE

## 2011-12-04 LAB — OB RESULTS CONSOLE RUBELLA ANTIBODY, IGM: Rubella: IMMUNE

## 2011-12-04 LAB — OB RESULTS CONSOLE ANTIBODY SCREEN: Antibody Screen: NEGATIVE

## 2011-12-19 ENCOUNTER — Emergency Department (INDEPENDENT_AMBULATORY_CARE_PROVIDER_SITE_OTHER)
Admission: EM | Admit: 2011-12-19 | Discharge: 2011-12-19 | Disposition: A | Discharge status: Home / Self Care | Payer: Medicaid Other | Source: Home / Self Care | Attending: Emergency Medicine | Admitting: Emergency Medicine

## 2011-12-19 ENCOUNTER — Encounter (HOSPITAL_COMMUNITY): Payer: Self-pay

## 2011-12-19 DIAGNOSIS — J209 Acute bronchitis, unspecified: Secondary | ICD-10-CM

## 2011-12-19 MED ORDER — ALBUTEROL SULFATE HFA 108 (90 BASE) MCG/ACT IN AERS
1.0000 | INHALATION_SPRAY | Freq: Four times a day (QID) | RESPIRATORY_TRACT | Status: DC | PRN
Start: 2011-12-19 — End: 2012-06-05

## 2011-12-19 MED ORDER — AZITHROMYCIN 250 MG PO TABS: ORAL_TABLET | ORAL | Status: AC

## 2011-12-19 NOTE — ED Notes (Signed)
Pt is pregnant.  C/o chest congestion, sore throat and cough for 2 weeks.  States has been coughing up green sputum for a couple of days, head hurts with coughing.  Denies fever.

## 2011-12-19 NOTE — Discharge Instructions (Signed)

## 2011-12-19 NOTE — ED Provider Notes (Signed)
Chief Complaint  Patient presents with  . URI  . Cough  . Sore Throat    History of Present Illness:   Jean Cabrera is a 31 year old female who is 4 months pregnant. For the past 2 weeks she's had intermittent symptoms of headache, cough productive of green sputum with occasional blood tinges, wheezing, sore throat, nasal congestion with green drainage, and chills. She denies any fever, sweats, history of asthma, abdominal pain, nausea, or vomiting. She has had no pregnancy-related complications.  Review of Systems:  Other than noted above, the patient denies any of the following symptoms. Systemic:  No fever, chills, sweats, fatigue, myalgias, headache, or anorexia. Eye:  No redness, pain or drainage. ENT:  No earache, nasal congestion, rhinorrhea, sinus pressure, or sore throat. Lungs:  No cough, sputum production, wheezing, shortness of breath. Or chest pain. GI:  No nausea, vomiting, abdominal pain or diarrhea. Skin:  No rash or itching.  PMFSH:  Past medical history, family history, social history, meds, and allergies were reviewed.  Physical Exam:   Vital signs:  BP 106/64  Pulse 76  Temp(Src) 98.8 F (37.1 C) (Oral)  Resp 16  SpO2 99%  LMP 09/03/2011 General:  Alert, in no distress. Eye:  No conjunctival injection or drainage. ENT:  TMs and canals were normal, without erythema or inflammation.  Nasal mucosa was clear and uncongested, without drainage.  Mucous membranes were moist.  Pharynx was clear, without exudate or drainage.  There were no oral ulcerations or lesions. Neck:  Supple, no adenopathy, tenderness or mass. Lungs:  No respiratory distress.  Lungs were clear to auscultation, without wheezes, rales or rhonchi.  Breath sounds were clear and equal bilaterally. Heart:  Regular rhythm, without gallops, murmers or rubs. Skin:  Clear, warm, and dry, without rash or lesions.  Medical Decision Making:  I would've liked to have gotten a chest x-ray on her, but given the fact  that she is in early pregnancy, I feel that the risk of this away the benefits at this time.    Assessment:   Diagnoses that have been ruled out:  None  Diagnoses that are still under consideration:  None  Final diagnoses:  Acute bronchitis      Plan:   1.  The following meds were prescribed:   New Prescriptions   ALBUTEROL (PROVENTIL HFA;VENTOLIN HFA) 108 (90 BASE) MCG/ACT INHALER    Inhale 1-2 puffs into the lungs every 6 (six) hours as needed for wheezing.   AZITHROMYCIN (ZITHROMAX Z-PAK) 250 MG TABLET    Take as directed.   2.  The patient was instructed in symptomatic care and handouts were given. 3.  The patient was told to return if becoming worse in any way, if no better in 3 or 4 days, and given some red flag symptoms that would indicate earlier return.   Reuben Likes, MD 12/19/11 1155

## 2012-04-27 ENCOUNTER — Ambulatory Visit (HOSPITAL_COMMUNITY)
Admission: AD | Admit: 2012-04-27 | Discharge: 2012-04-27 | Disposition: A | Discharge status: Home / Self Care | Payer: Medicaid Other | Source: Ambulatory Visit | Attending: Obstetrics & Gynecology | Admitting: Obstetrics & Gynecology

## 2012-04-27 DIAGNOSIS — R55 Syncope and collapse: Secondary | ICD-10-CM | POA: Insufficient documentation

## 2012-04-27 DIAGNOSIS — R000 Tachycardia, unspecified: Secondary | ICD-10-CM | POA: Insufficient documentation

## 2012-05-11 LAB — OB RESULTS CONSOLE GBS: GBS: NEGATIVE

## 2012-06-04 ENCOUNTER — Other Ambulatory Visit: Payer: Self-pay | Admitting: Obstetrics and Gynecology

## 2012-06-05 ENCOUNTER — Encounter (HOSPITAL_COMMUNITY): Payer: Self-pay | Admitting: *Deleted

## 2012-06-05 ENCOUNTER — Telehealth (HOSPITAL_COMMUNITY): Payer: Self-pay | Admitting: *Deleted

## 2012-06-05 ENCOUNTER — Encounter (HOSPITAL_COMMUNITY): Payer: Self-pay | Admitting: Pharmacist

## 2012-06-05 NOTE — Telephone Encounter (Signed)
Preadmission screen  

## 2012-06-09 ENCOUNTER — Encounter (HOSPITAL_COMMUNITY): Payer: Self-pay | Admitting: *Deleted

## 2012-06-09 ENCOUNTER — Inpatient Hospital Stay (HOSPITAL_COMMUNITY): Payer: Medicaid Other

## 2012-06-09 ENCOUNTER — Inpatient Hospital Stay (HOSPITAL_COMMUNITY)
Admission: RE | Admit: 2012-06-09 | Payer: Medicaid Other | Source: Ambulatory Visit | Admitting: Obstetrics and Gynecology

## 2012-06-09 ENCOUNTER — Encounter (HOSPITAL_COMMUNITY): Payer: Self-pay

## 2012-06-09 ENCOUNTER — Inpatient Hospital Stay (HOSPITAL_COMMUNITY): Payer: Medicaid Other | Admitting: Anesthesiology

## 2012-06-09 ENCOUNTER — Encounter (HOSPITAL_COMMUNITY): Payer: Self-pay | Admitting: Anesthesiology

## 2012-06-09 ENCOUNTER — Inpatient Hospital Stay (HOSPITAL_COMMUNITY)
Admission: AD | Admit: 2012-06-09 | Discharge: 2012-06-09 | Disposition: A | Discharge status: Home / Self Care | Payer: Medicaid Other | Source: Ambulatory Visit | Attending: Obstetrics and Gynecology | Admitting: Obstetrics and Gynecology

## 2012-06-09 ENCOUNTER — Encounter (HOSPITAL_COMMUNITY)
Admission: AD | Disposition: A | Discharge status: Home / Self Care | Payer: Self-pay | Source: Ambulatory Visit | Attending: Obstetrics and Gynecology

## 2012-06-09 ENCOUNTER — Inpatient Hospital Stay (HOSPITAL_COMMUNITY)
Admission: AD | Admit: 2012-06-09 | Discharge: 2012-06-11 | DRG: 775 | Disposition: A | Discharge status: Home / Self Care | Payer: Medicaid Other | Source: Ambulatory Visit | Attending: Obstetrics & Gynecology | Admitting: Obstetrics & Gynecology

## 2012-06-09 ENCOUNTER — Inpatient Hospital Stay (HOSPITAL_COMMUNITY)
Admission: RE | Admit: 2012-06-09 | Payer: Medicaid Other | Source: Ambulatory Visit | Admitting: Obstetrics & Gynecology

## 2012-06-09 DIAGNOSIS — O479 False labor, unspecified: Secondary | ICD-10-CM | POA: Insufficient documentation

## 2012-06-09 LAB — CBC
Hemoglobin: 12.2 g/dL (ref 12.0–15.0)
MCHC: 32 g/dL (ref 30.0–36.0)
Platelets: 302 10*3/uL (ref 150–400)
RBC: 4.62 MIL/uL (ref 3.87–5.11)

## 2012-06-09 LAB — TYPE AND SCREEN: ABO/RH(D): O POS

## 2012-06-09 LAB — RPR: RPR Ser Ql: NONREACTIVE

## 2012-06-09 SURGERY — Surgical Case
Anesthesia: Regional

## 2012-06-09 MED ORDER — FLEET ENEMA 7-19 GM/118ML RE ENEM: 1.0000 | ENEMA | RECTAL | Status: DC | PRN

## 2012-06-09 MED ORDER — OXYTOCIN 40 UNITS IN LACTATED RINGERS INFUSION - SIMPLE MED
62.5000 mL/h | Freq: Once | INTRAVENOUS | Status: DC
  Filled 2012-06-09: qty 1000

## 2012-06-09 MED ORDER — ONDANSETRON HCL 4 MG PO TABS: 4.0000 mg | ORAL_TABLET | ORAL | Status: DC | PRN

## 2012-06-09 MED ORDER — LACTATED RINGERS IV SOLN: 500.0000 mL | INTRAVENOUS | Status: DC | PRN

## 2012-06-09 MED ORDER — LIDOCAINE HCL (PF) 1 % IJ SOLN
INTRAMUSCULAR | Status: DC | PRN
  Administered 2012-06-09 (×2): 9 mL

## 2012-06-09 MED ORDER — ONDANSETRON HCL 4 MG/2ML IJ SOLN
4.0000 mg | INTRAMUSCULAR | Status: DC | PRN
Start: 2012-06-09 — End: 2012-06-11

## 2012-06-09 MED ORDER — DIPHENHYDRAMINE HCL 50 MG/ML IJ SOLN: 12.5000 mg | INTRAMUSCULAR | Status: DC | PRN

## 2012-06-09 MED ORDER — LIDOCAINE HCL (PF) 1 % IJ SOLN
30.0000 mL | INTRAMUSCULAR | Status: DC | PRN
  Filled 2012-06-09: qty 30

## 2012-06-09 MED ORDER — LACTATED RINGERS IV SOLN: 500.0000 mL | Freq: Once | INTRAVENOUS | Status: DC

## 2012-06-09 MED ORDER — LANOLIN HYDROUS EX OINT: TOPICAL_OINTMENT | CUTANEOUS | Status: DC | PRN

## 2012-06-09 MED ORDER — SIMETHICONE 80 MG PO CHEW: 80.0000 mg | CHEWABLE_TABLET | ORAL | Status: DC | PRN

## 2012-06-09 MED ORDER — SENNOSIDES-DOCUSATE SODIUM 8.6-50 MG PO TABS
2.0000 | ORAL_TABLET | Freq: Every day | ORAL | Status: DC
  Administered 2012-06-09 – 2012-06-10 (×2): 2 via ORAL

## 2012-06-09 MED ORDER — OXYCODONE-ACETAMINOPHEN 5-325 MG PO TABS: 1.0000 | ORAL_TABLET | ORAL | Status: DC | PRN

## 2012-06-09 MED ORDER — EPHEDRINE 5 MG/ML INJ: 10.0000 mg | INTRAVENOUS | Status: DC | PRN

## 2012-06-09 MED ORDER — FENTANYL 2.5 MCG/ML BUPIVACAINE 1/10 % EPIDURAL INFUSION (WH - ANES)
14.0000 mL/h | INTRAMUSCULAR | Status: DC
  Administered 2012-06-09: 14 mL/h via EPIDURAL
  Filled 2012-06-09 (×2): qty 60

## 2012-06-09 MED ORDER — OXYTOCIN BOLUS FROM INFUSION
250.0000 mL | Freq: Once | INTRAVENOUS | Status: DC
  Filled 2012-06-09: qty 500

## 2012-06-09 MED ORDER — FENTANYL 2.5 MCG/ML BUPIVACAINE 1/10 % EPIDURAL INFUSION (WH - ANES)
INTRAMUSCULAR | Status: DC | PRN
  Administered 2012-06-09: 14 mL/h via EPIDURAL

## 2012-06-09 MED ORDER — PHENYLEPHRINE 40 MCG/ML (10ML) SYRINGE FOR IV PUSH (FOR BLOOD PRESSURE SUPPORT)
80.0000 ug | PREFILLED_SYRINGE | INTRAVENOUS | Status: DC | PRN
  Filled 2012-06-09: qty 5

## 2012-06-09 MED ORDER — BENZOCAINE-MENTHOL 20-0.5 % EX AERO
1.0000 "application " | INHALATION_SPRAY | CUTANEOUS | Status: DC | PRN
  Administered 2012-06-09: 1 via TOPICAL
  Filled 2012-06-09: qty 56

## 2012-06-09 MED ORDER — ACETAMINOPHEN 325 MG PO TABS: 650.0000 mg | ORAL_TABLET | ORAL | Status: DC | PRN

## 2012-06-09 MED ORDER — EPHEDRINE 5 MG/ML INJ
10.0000 mg | INTRAVENOUS | Status: DC | PRN
  Filled 2012-06-09: qty 4

## 2012-06-09 MED ORDER — WITCH HAZEL-GLYCERIN EX PADS: 1.0000 "application " | MEDICATED_PAD | CUTANEOUS | Status: DC | PRN

## 2012-06-09 MED ORDER — PHENYLEPHRINE 40 MCG/ML (10ML) SYRINGE FOR IV PUSH (FOR BLOOD PRESSURE SUPPORT): 80.0000 ug | PREFILLED_SYRINGE | INTRAVENOUS | Status: DC | PRN

## 2012-06-09 MED ORDER — IBUPROFEN 600 MG PO TABS: 600.0000 mg | ORAL_TABLET | Freq: Four times a day (QID) | ORAL | Status: DC | PRN

## 2012-06-09 MED ORDER — PRENATAL MULTIVITAMIN CH
1.0000 | ORAL_TABLET | Freq: Every day | ORAL | Status: DC
  Administered 2012-06-10 – 2012-06-11 (×2): 1 via ORAL
  Filled 2012-06-09 (×2): qty 1

## 2012-06-09 MED ORDER — DIPHENHYDRAMINE HCL 25 MG PO CAPS: 25.0000 mg | ORAL_CAPSULE | Freq: Four times a day (QID) | ORAL | Status: DC | PRN

## 2012-06-09 MED ORDER — DIBUCAINE 1 % RE OINT: 1.0000 "application " | TOPICAL_OINTMENT | RECTAL | Status: DC | PRN

## 2012-06-09 MED ORDER — BUTORPHANOL TARTRATE 1 MG/ML IJ SOLN
1.0000 mg | INTRAMUSCULAR | Status: DC | PRN
Start: 2012-06-09 — End: 2012-06-09

## 2012-06-09 MED ORDER — IBUPROFEN 600 MG PO TABS
600.0000 mg | ORAL_TABLET | Freq: Four times a day (QID) | ORAL | Status: DC
  Administered 2012-06-09 – 2012-06-11 (×6): 600 mg via ORAL
  Filled 2012-06-09 (×7): qty 1

## 2012-06-09 MED ORDER — ZOLPIDEM TARTRATE 5 MG PO TABS: 5.0000 mg | ORAL_TABLET | Freq: Every evening | ORAL | Status: DC | PRN

## 2012-06-09 MED ORDER — TETANUS-DIPHTH-ACELL PERTUSSIS 5-2.5-18.5 LF-MCG/0.5 IM SUSP: 0.5000 mL | Freq: Once | INTRAMUSCULAR | Status: DC

## 2012-06-09 MED ORDER — LACTATED RINGERS IV SOLN
INTRAVENOUS | Status: DC
  Administered 2012-06-09 (×3): via INTRAVENOUS

## 2012-06-09 MED ORDER — TERBUTALINE SULFATE 1 MG/ML IJ SOLN: 0.2500 mg | Freq: Once | INTRAMUSCULAR | Status: DC | PRN

## 2012-06-09 MED ORDER — CITRIC ACID-SODIUM CITRATE 334-500 MG/5ML PO SOLN: 30.0000 mL | ORAL | Status: DC | PRN

## 2012-06-09 MED ORDER — OXYTOCIN 40 UNITS IN LACTATED RINGERS INFUSION - SIMPLE MED
1.0000 m[IU]/min | INTRAVENOUS | Status: DC
  Administered 2012-06-09: 2 m[IU]/min via INTRAVENOUS

## 2012-06-09 MED ORDER — ONDANSETRON HCL 4 MG/2ML IJ SOLN
4.0000 mg | Freq: Four times a day (QID) | INTRAMUSCULAR | Status: DC | PRN
  Administered 2012-06-09: 4 mg via INTRAVENOUS
  Filled 2012-06-09: qty 2

## 2012-06-09 NOTE — Anesthesia Procedure Notes (Signed)
Epidural Patient location during procedure: OB Start time: 06/09/2012 11:24 AM End time: 06/09/2012 11:28 AM  Staffing Anesthesiologist: Sandrea Hughs Performed by: anesthesiologist   Preanesthetic Checklist Completed: patient identified, site marked, surgical consent, pre-op evaluation, timeout performed, IV checked, risks and benefits discussed and monitors and equipment checked  Epidural Patient position: sitting Prep: site prepped and draped and DuraPrep Patient monitoring: continuous pulse ox and blood pressure Approach: midline Injection technique: LOR air  Needle:  Needle type: Tuohy  Needle gauge: 17 G Needle length: 9 cm and 9 Needle insertion depth: 6 cm Catheter type: closed end flexible Catheter size: 19 Gauge Catheter at skin depth: 10 cm Test dose: negative and Other  Assessment Sensory level: T8 Events: blood not aspirated, injection not painful, no injection resistance, negative IV test and no paresthesia  Additional Notes Reason for block:procedure for pain

## 2012-06-09 NOTE — MAU Note (Signed)
PT SAYS HAS BEEN HAVING UC.  PLANS FOR VERSION AT 8 AM-  IF  UNSUCCESSFUL- THEN SCH C/ S   AT 12NOON.Marland Kitchen  LAST WEEK 1 CM IN OFFICE.   DENIES HSV AND MRSA.

## 2012-06-09 NOTE — H&P (Signed)
31 y.o. G3P1011  Estimated Date of Delivery: 06/08/12 admitted at 40/[redacted] weeks gestation in early labor.  Prenatal Transfer Tool  Maternal Diabetes: No Genetic Screening: Declined Maternal Ultrasounds/Referrals: Normal Fetal Ultrasounds or other Referrals:  None Maternal Substance Abuse:  Yes:  Type: Smoker Significant Maternal Medications:  None Significant Maternal Lab Results: None Other Significant Pregnancy Complications:  None  Afebrile, VSS Heart and Lungs: No active disease Abdomen: soft, gravid, EFW AGA. Cervical exam:  4/80, vtx -2  Impression: Term Pregnancy, early labor  Plan:  AROM, augment if necessary

## 2012-06-09 NOTE — Anesthesia Preprocedure Evaluation (Signed)
Anesthesia Evaluation  Patient identified by MRN, date of birth, ID band Patient awake    Reviewed: Allergy & Precautions, H&P , NPO status , Patient's Chart, lab work & pertinent test results  Airway Mallampati: II TM Distance: >3 FB Neck ROM: full    Dental No notable dental hx.    Pulmonary neg pulmonary ROS,    Pulmonary exam normal       Cardiovascular negative cardio ROS      Neuro/Psych negative neurological ROS  negative psych ROS   GI/Hepatic negative GI ROS, Neg liver ROS,   Endo/Other  Morbid obesity  Renal/GU negative Renal ROS  negative genitourinary   Musculoskeletal negative musculoskeletal ROS (+)   Abdominal (+) + obese,   Peds negative pediatric ROS (+)  Hematology negative hematology ROS (+)   Anesthesia Other Findings   Reproductive/Obstetrics (+) Pregnancy                           Anesthesia Physical Anesthesia Plan  ASA: III  Anesthesia Plan: Epidural   Post-op Pain Management:    Induction:   Airway Management Planned:   Additional Equipment:   Intra-op Plan:   Post-operative Plan:   Informed Consent: I have reviewed the patients History and Physical, chart, labs and discussed the procedure including the risks, benefits and alternatives for the proposed anesthesia with the patient or authorized representative who has indicated his/her understanding and acceptance.     Plan Discussed with:   Anesthesia Plan Comments:         Anesthesia Quick Evaluation  

## 2012-06-10 LAB — CBC
HCT: 33.8 % — ABNORMAL LOW (ref 36.0–46.0)
Hemoglobin: 10.8 g/dL — ABNORMAL LOW (ref 12.0–15.0)
MCH: 26.5 pg (ref 26.0–34.0)
MCHC: 32 g/dL (ref 30.0–36.0)
MCV: 83 fL (ref 78.0–100.0)
RDW: 14.8 % (ref 11.5–15.5)

## 2012-06-10 NOTE — Progress Notes (Signed)
PPD#1 Pt without complaints. Lochia-wnl VSSAF ABD- soft, non tender IMP/ stable Plan/ routine care

## 2012-06-10 NOTE — Progress Notes (Signed)
UR chart review completed.  

## 2012-06-10 NOTE — Anesthesia Postprocedure Evaluation (Signed)
Anesthesia Post Note  Patient: Jean Cabrera  Procedure(s) Performed: * No procedures listed *  Anesthesia type: Epidural  Patient location: Mother/Baby  Post pain: Pain level controlled  Post assessment: Post-op Vital signs reviewed  Last Vitals:  Filed Vitals:   06/10/12 0530  BP: 108/66  Pulse: 62  Temp: 36.6 C  Resp: 18    Post vital signs: Reviewed  Level of consciousness: awake  Complications: No apparent anesthesia complications

## 2012-06-10 NOTE — Anesthesia Postprocedure Evaluation (Signed)
Anesthesia Post Note  Patient: Jean Cabrera  Procedure(s) Performed: * No procedures listed *  Anesthesia type: Epidural  Patient location: Mother/Baby  Post pain: Pain level controlled  Post assessment: Post-op Vital signs reviewed  Last Vitals:  Filed Vitals:   06/10/12 0530  BP: 108/66  Pulse: 62  Temp: 36.6 C  Resp: 18    Post vital signs: Reviewed  Level of consciousness:alert  Complications: No apparent anesthesia complications

## 2012-06-10 NOTE — Addendum Note (Signed)
Addendum  created 06/10/12 1120 by Sandrea Hughs., MD   Modules edited:Notes Section

## 2012-06-11 NOTE — Discharge Summary (Signed)
Obstetric Discharge Summary Reason for Admission: onset of labor Prenatal Procedures: none Intrapartum Procedures: spontaneous vaginal delivery Postpartum Procedures: none Complications-Operative and Postpartum: none Hemoglobin  Date Value Range Status  06/10/2012 10.8* 12.0 - 15.0 g/dL Final     HCT  Date Value Range Status  06/10/2012 33.8* 36.0 - 46.0 % Final    Discharge Diagnoses: Term Pregnancy-delivered  Discharge Information: Date: 06/11/2012 Activity: pelvic rest Diet: routine Medications: Ibuprofen Condition: stable Instructions: refer to practice specific booklet Discharge to: home Follow-up Information    Schedule an appointment as soon as possible for a visit with Mickel Baas, MD.   Contact information:   [address] [place] [address] [phone]          Newborn Data: Live born female  Birth Weight: 6 lb 14.4 oz (3130 g) APGAR: 6, 9  Home with mother.  Claxton Levitz A 06/11/2012, 7:37 AM

## 2012-06-11 NOTE — Progress Notes (Signed)
Patient is eating, ambulating, voiding.  Pain control is good.  Filed Vitals:   06/10/12 0530 06/10/12 1354 06/10/12 2139 06/11/12 0512  BP: 108/66 116/67 102/64 95/57  Pulse: 62 65 64 80  Temp: 97.9 F (36.6 C) 97.8 F (36.6 C) 97.7 F (36.5 C) 97.9 F (36.6 C)  TempSrc: Oral Oral Oral Oral  Resp: 18 18 18 18   Height:      Weight:        Fundus firm Perineum without swelling.  Lab Results  Component Value Date   WBC 12.0* 06/10/2012   HGB 10.8* 06/10/2012   HCT 33.8* 06/10/2012   MCV 83.0 06/10/2012   PLT 268 06/10/2012    --/--/O POS, O POS (09/03 1020)/RI  A/P Post partum day 2.  Routine care.  Expect d/c today.    Jean Cabrera A

## 2012-08-03 ENCOUNTER — Emergency Department (INDEPENDENT_AMBULATORY_CARE_PROVIDER_SITE_OTHER)
Admission: EM | Admit: 2012-08-03 | Discharge: 2012-08-03 | Disposition: A | Discharge status: Home / Self Care | Payer: Medicaid Other | Source: Home / Self Care | Attending: Family Medicine | Admitting: Family Medicine

## 2012-08-03 ENCOUNTER — Encounter (HOSPITAL_COMMUNITY): Payer: Self-pay

## 2012-08-03 DIAGNOSIS — J020 Streptococcal pharyngitis: Secondary | ICD-10-CM

## 2012-08-03 MED ORDER — AMOXICILLIN 500 MG PO CAPS: 500.0000 mg | ORAL_CAPSULE | Freq: Three times a day (TID) | ORAL | Status: DC

## 2012-08-03 NOTE — ED Provider Notes (Signed)
History     CSN: [id number]  Arrival date & time 08/03/12  1201   First MD Initiated Contact with Patient 08/03/12 1302      Chief Complaint  Patient presents with  . Sore Throat    (Consider location/radiation/quality/duration/timing/severity/associated sxs/prior treatment) Patient is a 31 y.o. female presenting with pharyngitis.  Sore Throat This is a new problem. The current episode started yesterday. The problem has not changed since onset.Pertinent negatives include no chest pain, no abdominal pain and no headaches. The symptoms are aggravated by swallowing.    Past Medical History  Diagnosis Date  . Abnormal Pap smear   . Anemia   . Uterine fibroids affecting pregnancy   . Late prenatal care     13 weeks  . Myringotomy tube status     Past Surgical History  Procedure Date  . Colposcopy   . Dilation and curettage of uterus     Family History  Problem Relation Age of Onset  . Thyroid disease Paternal Aunt   . COPD Paternal Aunt     thyroid cancer  . Alcohol abuse Paternal Aunt   . Drug abuse Paternal Aunt   . Mental illness Cousin     bipolar  . Alcohol abuse Father   . Drug abuse Father     History  Substance Use Topics  . Smoking status: Never Smoker   . Smokeless tobacco: Never Used  . Alcohol Use: No    OB History    Grav Para Term Preterm Abortions TAB SAB Ect Mult Living   3 2 2  1 1  0   2      Review of Systems  Constitutional: Negative.   HENT: Positive for sore throat. Negative for congestion, rhinorrhea and postnasal drip.   Respiratory: Negative for cough.   Cardiovascular: Negative for chest pain.  Gastrointestinal: Negative for nausea, vomiting and abdominal pain.  Neurological: Negative for headaches.    Allergies  Review of patient's allergies indicates no known allergies.  Home Medications   Current Outpatient Rx  Name Route Sig Dispense Refill  . AMOXICILLIN 500 MG PO CAPS Oral Take 1 capsule (500 mg total) by mouth 3  (three) times daily. 30 capsule 0  . PRENATAL MULTIVITAMIN CH Oral Take 1 tablet by mouth daily.      BP 127/82  Pulse 71  Temp 98.5 F (36.9 C) (Oral)  Resp 16  SpO2 100%  Physical Exam  Nursing note and vitals reviewed. Constitutional: She is oriented to person, place, and time. She appears well-developed and well-nourished.  HENT:  Head: Normocephalic.  Right Ear: External ear normal.  Left Ear: External ear normal.  Mouth/Throat: Oropharynx is clear and moist.  Eyes: Conjunctivae normal are normal. Pupils are equal, round, and reactive to light.  Neck: Normal range of motion. Neck supple.  Cardiovascular: Normal rate, regular rhythm, normal heart sounds and intact distal pulses.   Pulmonary/Chest: Effort normal and breath sounds normal.  Lymphadenopathy:    She has no cervical adenopathy.  Neurological: She is alert and oriented to person, place, and time.  Skin: Skin is warm and dry.    ED Course  Procedures (including critical care time)  Labs Reviewed  POCT RAPID STREP A ([facility] ONLY) - Abnormal; Notable for the following:    Streptococcus, Group A Screen (Direct) POSITIVE (*)     All other components within normal limits   No results found.   1. Strep throat  MDM  Strep pos.        Linna Hoff, MD 08/03/12 [phone]

## 2012-08-03 NOTE — ED Notes (Signed)
C/o sore throat x 1 day.

## 2012-08-04 NOTE — ED Notes (Signed)
Jean Cabrera cma responsible for patient.

## 2013-09-07 LAB — OB RESULTS CONSOLE GC/CHLAMYDIA
Chlamydia: NEGATIVE
GC PROBE AMP, GENITAL: NEGATIVE

## 2013-09-07 LAB — OB RESULTS CONSOLE ANTIBODY SCREEN: ANTIBODY SCREEN: NEGATIVE

## 2013-09-07 LAB — OB RESULTS CONSOLE HIV ANTIBODY (ROUTINE TESTING): HIV: NONREACTIVE

## 2013-09-07 LAB — OB RESULTS CONSOLE RUBELLA ANTIBODY, IGM: Rubella: IMMUNE

## 2013-09-07 LAB — OB RESULTS CONSOLE ABO/RH: RH TYPE: POSITIVE

## 2013-09-07 LAB — OB RESULTS CONSOLE RPR: RPR: NONREACTIVE

## 2013-09-07 LAB — OB RESULTS CONSOLE HEPATITIS B SURFACE ANTIGEN: Hepatitis B Surface Ag: NEGATIVE

## 2014-02-23 LAB — OB RESULTS CONSOLE GBS: STREP GROUP B AG: NEGATIVE

## 2014-03-29 ENCOUNTER — Encounter (HOSPITAL_COMMUNITY): Payer: Self-pay | Admitting: *Deleted

## 2014-03-29 ENCOUNTER — Inpatient Hospital Stay (HOSPITAL_COMMUNITY)
Admission: AD | Admit: 2014-03-29 | Discharge: 2014-03-29 | Disposition: A | Discharge status: Home / Self Care | Payer: 59 | Source: Ambulatory Visit | Attending: Obstetrics and Gynecology | Admitting: Obstetrics and Gynecology

## 2014-03-29 DIAGNOSIS — O479 False labor, unspecified: Secondary | ICD-10-CM | POA: Insufficient documentation

## 2014-03-29 NOTE — Discharge Instructions (Signed)
Keep your scheduled appointment for prenatal care. Call the office or provider on call with further concerns, or return to [facility] as needed.

## 2014-03-31 ENCOUNTER — Encounter (HOSPITAL_COMMUNITY): Payer: Self-pay | Admitting: *Deleted

## 2014-03-31 ENCOUNTER — Inpatient Hospital Stay (HOSPITAL_COMMUNITY)
Admission: AD | Admit: 2014-03-31 | Discharge: 2014-03-31 | Disposition: A | Discharge status: Home / Self Care | Payer: 59 | Source: Ambulatory Visit | Attending: Obstetrics and Gynecology | Admitting: Obstetrics and Gynecology

## 2014-03-31 DIAGNOSIS — O479 False labor, unspecified: Secondary | ICD-10-CM | POA: Insufficient documentation

## 2014-03-31 NOTE — Discharge Instructions (Signed)
Braxton Hicks Contractions °Contractions of the uterus can occur throughout pregnancy. Contractions are not always a sign that you are in labor.  °WHAT ARE BRAXTON HICKS CONTRACTIONS?  °Contractions that occur before labor are called Braxton Hicks contractions, or false labor. Toward the end of pregnancy (32-34 weeks), these contractions can develop more often and may become more forceful. This is not true labor because these contractions do not result in opening (dilatation) and thinning of the cervix. They are sometimes difficult to tell apart from true labor because these contractions can be forceful and people have different pain tolerances. You should not feel embarrassed if you go to the hospital with false labor. Sometimes, the only way to tell if you are in true labor is for your health care provider to look for changes in the cervix. °If there are no prenatal problems or other health problems associated with the pregnancy, it is completely safe to be sent home with false labor and await the onset of true labor. °HOW CAN YOU TELL THE DIFFERENCE BETWEEN TRUE AND FALSE LABOR? °False Labor °· The contractions of false labor are usually shorter and not as hard as those of true labor.   °· The contractions are usually irregular.   °· The contractions are often felt in the front of the lower abdomen and in the groin.   °· The contractions may go away when you walk around or change positions while lying down.   °· The contractions get weaker and are shorter lasting as time goes on.   °· The contractions do not usually become progressively stronger, regular, and closer together as with true labor.   °True Labor °· Contractions in true labor last 30-70 seconds, become very regular, usually become more intense, and increase in frequency.   °· The contractions do not go away with walking.   °· The discomfort is usually felt in the top of the uterus and spreads to the lower abdomen and low back.   °· True labor can be  determined by your health care provider with an exam. This will show that the cervix is dilating and getting thinner.   °WHAT TO REMEMBER °· Keep up with your usual exercises and follow other instructions given by your health care provider.   °· Take medicines as directed by your health care provider.   °· Keep your regular prenatal appointments.   °· Eat and drink lightly if you think you are going into labor.   °· If Braxton Hicks contractions are making you uncomfortable:   °¨ Change your position from lying down or resting to walking, or from walking to resting.   °¨ Sit and rest in a tub of warm water.   °¨ Drink 2-3 glasses of water. Dehydration may cause these contractions.   °¨ Do slow and deep breathing several times an hour.   °WHEN SHOULD I SEEK IMMEDIATE MEDICAL CARE? °Seek immediate medical care if: °· Your contractions become stronger, more regular, and closer together.   °· You have fluid leaking or gushing from your vagina.   °· You have a fever.   °· You pass blood-tinged mucus.   °· You have vaginal bleeding.   °· You have continuous abdominal pain.   °· You have low back pain that you never had before.   °· You feel your baby's head pushing down and causing pelvic pressure.   °· Your baby is not moving as much as it used to.   °Document Released: 09/23/2005 Document Revised: 09/28/2013 Document Reviewed: 07/05/2013 °[employer]® Patient Information ©2015 [employer]. This information is not intended to replace advice given to you by your health care   provider. Make sure you discuss any questions you have with your health care provider. ° °Fetal Movement Counts °Patient Name: __________________________________________________ Patient Due Date: ____________________ °Performing a fetal movement count is highly recommended in high-risk pregnancies, but it is good for every pregnant woman to do. Your caregiver may ask you to start counting fetal movements at 28 weeks of the pregnancy. Fetal movements  often increase: °· After eating a full meal. °· After physical activity. °· After eating or drinking something sweet or cold. °· At rest. °Pay attention to when you feel the baby is most active. This will help you notice a pattern of your baby's sleep and wake cycles and what factors contribute to an increase in fetal movement. It is important to perform a fetal movement count at the same time each day when your baby is normally most active.  °HOW TO COUNT FETAL MOVEMENTS °1. Find a quiet and comfortable area to sit or lie down on your left side. Lying on your left side provides the best blood and oxygen circulation to your baby. °2. Write down the day and time on a sheet of paper or in a journal. °3. Start counting kicks, flutters, swishes, rolls, or jabs in a 2 hour period. You should feel at least 10 movements within 2 hours. °4. If you do not feel 10 movements in 2 hours, wait 2-3 hours and count again. Look for a change in the pattern or not enough counts in 2 hours. °SEEK MEDICAL CARE IF: °· You feel less than 10 counts in 2 hours, tried twice. °· There is no movement in over an hour. °· The pattern is changing or taking longer each day to reach 10 counts in 2 hours. °· You feel the baby is not moving as he or she usually does. °Date: ____________ Movements: ____________ Start time: ____________ Finish time: ____________  °Date: ____________ Movements: ____________ Start time: ____________ Finish time: ____________ °Date: ____________ Movements: ____________ Start time: ____________ Finish time: ____________ °Date: ____________ Movements: ____________ Start time: ____________ Finish time: ____________ °Date: ____________ Movements: ____________ Start time: ____________ Finish time: ____________ °Date: ____________ Movements: ____________ Start time: ____________ Finish time: ____________ °Date: ____________ Movements: ____________ Start time: ____________ Finish time: ____________ °Date: ____________  Movements: ____________ Start time: ____________ Finish time: ____________  °Date: ____________ Movements: ____________ Start time: ____________ Finish time: ____________ °Date: ____________ Movements: ____________ Start time: ____________ Finish time: ____________ °Date: ____________ Movements: ____________ Start time: ____________ Finish time: ____________ °Date: ____________ Movements: ____________ Start time: ____________ Finish time: ____________ °Date: ____________ Movements: ____________ Start time: ____________ Finish time: ____________ °Date: ____________ Movements: ____________ Start time: ____________ Finish time: ____________ °Date: ____________ Movements: ____________ Start time: ____________ Finish time: ____________  °Date: ____________ Movements: ____________ Start time: ____________ Finish time: ____________ °Date: ____________ Movements: ____________ Start time: ____________ Finish time: ____________ °Date: ____________ Movements: ____________ Start time: ____________ Finish time: ____________ °Date: ____________ Movements: ____________ Start time: ____________ Finish time: ____________ °Date: ____________ Movements: ____________ Start time: ____________ Finish time: ____________ °Date: ____________ Movements: ____________ Start time: ____________ Finish time: ____________ °Date: ____________ Movements: ____________ Start time: ____________ Finish time: ____________  °Date: ____________ Movements: ____________ Start time: ____________ Finish time: ____________ °Date: ____________ Movements: ____________ Start time: ____________ Finish time: ____________ °Date: ____________ Movements: ____________ Start time: ____________ Finish time: ____________ °Date: ____________ Movements: ____________ Start time: ____________ Finish time: ____________ °Date: ____________ Movements: ____________ Start time: ____________ Finish time: ____________ °Date: ____________ Movements: ____________ Start time:  ____________ Finish time: ____________ °Date: ____________ Movements: ____________   Start time: ____________ Finish time: ____________  °Date: ____________ Movements: ____________ Start time: ____________ Finish time: ____________ °Date: ____________ Movements: ____________ Start time: ____________ Finish time: ____________ °Date: ____________ Movements: ____________ Start time: ____________ Finish time: ____________ °Date: ____________ Movements: ____________ Start time: ____________ Finish time: ____________ °Date: ____________ Movements: ____________ Start time: ____________ Finish time: ____________ °Date: ____________ Movements: ____________ Start time: ____________ Finish time: ____________ °Date: ____________ Movements: ____________ Start time: ____________ Finish time: ____________  °Date: ____________ Movements: ____________ Start time: ____________ Finish time: ____________ °Date: ____________ Movements: ____________ Start time: ____________ Finish time: ____________ °Date: ____________ Movements: ____________ Start time: ____________ Finish time: ____________ °Date: ____________ Movements: ____________ Start time: ____________ Finish time: ____________ °Date: ____________ Movements: ____________ Start time: ____________ Finish time: ____________ °Date: ____________ Movements: ____________ Start time: ____________ Finish time: ____________ °Date: ____________ Movements: ____________ Start time: ____________ Finish time: ____________  °Date: ____________ Movements: ____________ Start time: ____________ Finish time: ____________ °Date: ____________ Movements: ____________ Start time: ____________ Finish time: ____________ °Date: ____________ Movements: ____________ Start time: ____________ Finish time: ____________ °Date: ____________ Movements: ____________ Start time: ____________ Finish time: ____________ °Date: ____________ Movements: ____________ Start time: ____________ Finish time: ____________ °Date:  ____________ Movements: ____________ Start time: ____________ Finish time: ____________ °Date: ____________ Movements: ____________ Start time: ____________ Finish time: ____________  °Date: ____________ Movements: ____________ Start time: ____________ Finish time: ____________ °Date: ____________ Movements: ____________ Start time: ____________ Finish time: ____________ °Date: ____________ Movements: ____________ Start time: ____________ Finish time: ____________ °Date: ____________ Movements: ____________ Start time: ____________ Finish time: ____________ °Date: ____________ Movements: ____________ Start time: ____________ Finish time: ____________ °Date: ____________ Movements: ____________ Start time: ____________ Finish time: ____________ °Document Released: 10/23/2006 Document Revised: 09/09/2012 Document Reviewed: 07/20/2012 °[employer]® Patient Information ©2015 [employer]. This information is not intended to replace advice given to you by your health care provider. Make sure you discuss any questions you have with your health care provider. ° °

## 2014-03-31 NOTE — [facility] Note (Signed)
contractions 

## 2014-04-05 ENCOUNTER — Telehealth (HOSPITAL_COMMUNITY): Payer: Self-pay | Admitting: *Deleted

## 2014-04-05 NOTE — Telephone Encounter (Signed)
Preadmission screen  

## 2014-04-08 ENCOUNTER — Encounter (HOSPITAL_COMMUNITY): Payer: Self-pay

## 2014-04-08 ENCOUNTER — Inpatient Hospital Stay (HOSPITAL_COMMUNITY): Payer: 59 | Admitting: Anesthesiology

## 2014-04-08 ENCOUNTER — Inpatient Hospital Stay (HOSPITAL_COMMUNITY)
Admission: RE | Admit: 2014-04-08 | Discharge: 2014-04-12 | DRG: 765 | Disposition: A | Discharge status: Home / Self Care | Payer: 59 | Source: Ambulatory Visit | Attending: Obstetrics and Gynecology | Admitting: Obstetrics and Gynecology

## 2014-04-08 ENCOUNTER — Encounter (HOSPITAL_COMMUNITY): Payer: 59 | Admitting: Anesthesiology

## 2014-04-08 VITALS — BP 115/65 | HR 81 | Temp 98.7°F | Resp 18 | Ht 63.5 in | Wt 207.0 lb

## 2014-04-08 DIAGNOSIS — O34599 Maternal care for other abnormalities of gravid uterus, unspecified trimester: Secondary | ICD-10-CM | POA: Diagnosis present

## 2014-04-08 DIAGNOSIS — O3410 Maternal care for benign tumor of corpus uteri, unspecified trimester: Secondary | ICD-10-CM

## 2014-04-08 DIAGNOSIS — O480 Post-term pregnancy: Principal | ICD-10-CM | POA: Diagnosis present

## 2014-04-08 DIAGNOSIS — O330 Maternal care for disproportion due to deformity of maternal pelvic bones: Secondary | ICD-10-CM | POA: Diagnosis present

## 2014-04-08 DIAGNOSIS — O411090 Infection of amniotic sac and membranes, unspecified, unspecified trimester, not applicable or unspecified: Secondary | ICD-10-CM | POA: Diagnosis present

## 2014-04-08 DIAGNOSIS — O0930 Supervision of pregnancy with insufficient antenatal care, unspecified trimester: Secondary | ICD-10-CM

## 2014-04-08 DIAGNOSIS — Z051 Observation and evaluation of newborn for suspected infectious condition ruled out: Secondary | ICD-10-CM

## 2014-04-08 DIAGNOSIS — O339 Maternal care for disproportion, unspecified: Secondary | ICD-10-CM | POA: Diagnosis present

## 2014-04-08 DIAGNOSIS — O9902 Anemia complicating childbirth: Secondary | ICD-10-CM | POA: Diagnosis present

## 2014-04-08 DIAGNOSIS — D649 Anemia, unspecified: Secondary | ICD-10-CM | POA: Diagnosis present

## 2014-04-08 DIAGNOSIS — D259 Leiomyoma of uterus, unspecified: Secondary | ICD-10-CM | POA: Diagnosis present

## 2014-04-08 DIAGNOSIS — Z9189 Other specified personal risk factors, not elsewhere classified: Secondary | ICD-10-CM

## 2014-04-08 LAB — CBC
HCT: 38 % (ref 36.0–46.0)
Hemoglobin: 12.2 g/dL (ref 12.0–15.0)
MCH: 27.1 pg (ref 26.0–34.0)
MCHC: 32.1 g/dL (ref 30.0–36.0)
MCV: 84.3 fL (ref 78.0–100.0)
Platelets: 279 10*3/uL (ref 150–400)
RBC: 4.51 MIL/uL (ref 3.87–5.11)
RDW: 14.9 % (ref 11.5–15.5)
WBC: 6 10*3/uL (ref 4.0–10.5)

## 2014-04-08 LAB — TYPE AND SCREEN
ABO/RH(D): O POS
Antibody Screen: NEGATIVE

## 2014-04-08 LAB — RPR

## 2014-04-08 MED ORDER — OXYTOCIN 40 UNITS IN LACTATED RINGERS INFUSION - SIMPLE MED
1.0000 m[IU]/min | INTRAVENOUS | Status: DC
  Administered 2014-04-08: 2 m[IU]/min via INTRAVENOUS
  Filled 2014-04-08: qty 1000

## 2014-04-08 MED ORDER — BUTORPHANOL TARTRATE 1 MG/ML IJ SOLN: 1.0000 mg | INTRAMUSCULAR | Status: DC | PRN

## 2014-04-08 MED ORDER — OXYCODONE-ACETAMINOPHEN 5-325 MG PO TABS: 1.0000 | ORAL_TABLET | ORAL | Status: DC | PRN

## 2014-04-08 MED ORDER — LIDOCAINE HCL (PF) 1 % IJ SOLN
30.0000 mL | INTRAMUSCULAR | Status: DC | PRN
  Filled 2014-04-08: qty 30

## 2014-04-08 MED ORDER — LACTATED RINGERS IV SOLN: 500.0000 mL | Freq: Once | INTRAVENOUS | Status: DC

## 2014-04-08 MED ORDER — TERBUTALINE SULFATE 1 MG/ML IJ SOLN: 0.2500 mg | Freq: Once | INTRAMUSCULAR | Status: AC | PRN

## 2014-04-08 MED ORDER — FENTANYL 2.5 MCG/ML BUPIVACAINE 1/10 % EPIDURAL INFUSION (WH - ANES)
INTRAMUSCULAR | Status: AC
  Administered 2014-04-08: 14 mL/h via EPIDURAL
  Filled 2014-04-08: qty 125

## 2014-04-08 MED ORDER — GENTAMICIN SULFATE 40 MG/ML IJ SOLN
160.0000 mg | Freq: Three times a day (TID) | INTRAVENOUS | Status: DC
  Administered 2014-04-08: 160 mg via INTRAVENOUS
  Filled 2014-04-08 (×3): qty 4

## 2014-04-08 MED ORDER — OXYTOCIN BOLUS FROM INFUSION: 500.0000 mL | INTRAVENOUS | Status: DC

## 2014-04-08 MED ORDER — ACETAMINOPHEN 325 MG PO TABS
650.0000 mg | ORAL_TABLET | ORAL | Status: DC | PRN
  Administered 2014-04-08: 650 mg via ORAL
  Filled 2014-04-08: qty 2

## 2014-04-08 MED ORDER — SODIUM CHLORIDE 0.9 % IV SOLN
2.0000 g | Freq: Four times a day (QID) | INTRAVENOUS | Status: DC
  Administered 2014-04-08: 2 g via INTRAVENOUS
  Filled 2014-04-08 (×3): qty 2000

## 2014-04-08 MED ORDER — IBUPROFEN 600 MG PO TABS: 600.0000 mg | ORAL_TABLET | Freq: Four times a day (QID) | ORAL | Status: DC | PRN

## 2014-04-08 MED ORDER — LACTATED RINGERS IV SOLN
500.0000 mL | INTRAVENOUS | Status: DC | PRN
  Administered 2014-04-08 (×2): 500 mL via INTRAVENOUS

## 2014-04-08 MED ORDER — OXYTOCIN 40 UNITS IN LACTATED RINGERS INFUSION - SIMPLE MED: 62.5000 mL/h | INTRAVENOUS | Status: DC

## 2014-04-08 MED ORDER — ZOLPIDEM TARTRATE 5 MG PO TABS: 5.0000 mg | ORAL_TABLET | Freq: Every evening | ORAL | Status: DC | PRN

## 2014-04-08 MED ORDER — DIPHENHYDRAMINE HCL 50 MG/ML IJ SOLN: 12.5000 mg | INTRAMUSCULAR | Status: DC | PRN

## 2014-04-08 MED ORDER — CITRIC ACID-SODIUM CITRATE 334-500 MG/5ML PO SOLN
30.0000 mL | ORAL | Status: DC | PRN
Start: 2014-04-08 — End: 2014-04-09
  Administered 2014-04-09: 30 mL via ORAL
  Filled 2014-04-08: qty 15

## 2014-04-08 MED ORDER — PHENYLEPHRINE 40 MCG/ML (10ML) SYRINGE FOR IV PUSH (FOR BLOOD PRESSURE SUPPORT)
PREFILLED_SYRINGE | INTRAVENOUS | Status: AC
Start: 2014-04-08 — End: 2014-04-08
  Filled 2014-04-08: qty 10

## 2014-04-08 MED ORDER — PHENYLEPHRINE 40 MCG/ML (10ML) SYRINGE FOR IV PUSH (FOR BLOOD PRESSURE SUPPORT)
80.0000 ug | PREFILLED_SYRINGE | INTRAVENOUS | Status: AC | PRN
  Administered 2014-04-08 (×2): 80 ug via INTRAVENOUS
  Administered 2014-04-09: 40 ug via INTRAVENOUS

## 2014-04-08 MED ORDER — ONDANSETRON HCL 4 MG/2ML IJ SOLN: 4.0000 mg | Freq: Four times a day (QID) | INTRAMUSCULAR | Status: DC | PRN

## 2014-04-08 MED ORDER — FENTANYL 2.5 MCG/ML BUPIVACAINE 1/10 % EPIDURAL INFUSION (WH - ANES)
14.0000 mL/h | INTRAMUSCULAR | Status: DC | PRN
Start: 2014-04-08 — End: 2014-04-09
  Administered 2014-04-08 – 2014-04-09 (×3): 14 mL/h via EPIDURAL
  Filled 2014-04-08 (×2): qty 125

## 2014-04-08 MED ORDER — EPHEDRINE 5 MG/ML INJ: 10.0000 mg | INTRAVENOUS | Status: DC | PRN

## 2014-04-08 MED ORDER — PHENYLEPHRINE 40 MCG/ML (10ML) SYRINGE FOR IV PUSH (FOR BLOOD PRESSURE SUPPORT): 80.0000 ug | PREFILLED_SYRINGE | INTRAVENOUS | Status: DC | PRN

## 2014-04-08 MED ORDER — LIDOCAINE HCL (PF) 1 % IJ SOLN
INTRAMUSCULAR | Status: DC | PRN
Start: 2014-04-08 — End: 2014-04-09
  Administered 2014-04-08 (×2): 5 mL

## 2014-04-08 MED ORDER — LACTATED RINGERS IV SOLN
INTRAVENOUS | Status: DC
  Administered 2014-04-08: 125 mL/h via INTRAVENOUS
  Administered 2014-04-08 – 2014-04-09 (×3): via INTRAVENOUS

## 2014-04-08 NOTE — Progress Notes (Signed)
ANTIBIOTIC CONSULT NOTE - INITIAL  Pharmacy Consult for Gentamicin Indication: Chorioamnionitis  Allergies  Allergen Reactions  . Blueberry [Vaccinium Angustifolium] Itching and Swelling  . Kiwi Extract Swelling  . Latex Other (See Comments)    Causes inflammation.    Patient Measurements: Height: 5' 3.5" (161.3 cm) Weight: 207 lb (93.895 kg) IBW/kg (Calculated) : 53.55 Adjusted Body Weight:65.6 kg  Vital Signs: Temp: 100.5 F (38.1 C) (07/03 2237) Temp src: Axillary (07/03 2237) BP: 125/92 mmHg (07/03 2300) Pulse Rate: 96 (07/03 2300) Intake/Output from previous day:   Intake/Output from this shift: Total I/O In: -  Out: 600 [Urine:600]  Labs:  Recent Labs  04/08/14 0755  WBC 6.0  HGB 12.2  PLT 279   Estimated Creatinine Clearance: 113.1 ml/min (by C-G formula based on Cr of 0.8). No results found for this basename: VANCOTROUGH, VANCOPEAK, VANCORANDOM, GENTTROUGH, GENTPEAK, GENTRANDOM, TOBRATROUGH, TOBRAPEAK, TOBRARND, AMIKACINPEAK, AMIKACINTROU, AMIKACIN,  in the last 72 hours   Microbiology: No results found for this or any previous visit (from the past 720 hour(s)).  Medical History: Past Medical History  Diagnosis Date  . Abnormal Pap smear   . Anemia   . Uterine fibroids affecting pregnancy   . Late prenatal care     13 weeks  . Myringotomy tube status     Medications:  Ampicillin 2 GM IV every 6 hours  Assessment: 33 yo G4P2 at 41 weeks in labor; now with increased temperature and presumed chorioamnionitis.   Goal of Therapy:  Gentamicin peaks 6-8 mcg/ml; troughs <1 mcg/ml  Plan:  Gentamicin 160 mg IV every 8 hours. Serum creatinine with next labs if gentamicin is continued Serum gentamicin levels as indicated.  Norberto Sorenson 04/08/2014,11:58 PM

## 2014-04-08 NOTE — Anesthesia Preprocedure Evaluation (Signed)

## 2014-04-08 NOTE — Progress Notes (Signed)
Dr. Dawayne Cirri notified of blood pressures and interventions. Turned pt to left side, gave 2nd dose of phenylephrine 50mcg. Pt states is "feeling better"

## 2014-04-08 NOTE — Anesthesia Procedure Notes (Signed)
Epidural Patient location during procedure: OB Start time: 04/08/2014 11:05 AM  Staffing Anesthesiologist: Rudean Curt Performed by: anesthesiologist   Preanesthetic Checklist Completed: patient identified, site marked, surgical consent, pre-op evaluation, timeout performed, IV checked, risks and benefits discussed and monitors and equipment checked  Epidural Patient position: sitting Prep: site prepped and draped and DuraPrep Patient monitoring: continuous pulse ox and blood pressure Approach: midline Location: L3-L4 Injection technique: LOR air  Needle:  Needle type: Tuohy  Needle gauge: 17 G Needle length: 9 cm and 9 Needle insertion depth: 5 cm cm Catheter type: closed end flexible Catheter size: 19 Gauge Catheter at skin depth: 10 cm Test dose: negative  Assessment Events: blood not aspirated, injection not painful, no injection resistance, negative IV test and no paresthesia  Additional Notes Patient identified.  Risk benefits discussed including failed block, incomplete pain control, headache, nerve damage, paralysis, blood pressure changes, nausea, vomiting, reactions to medication both toxic or allergic, and postpartum back pain.  Patient expressed understanding and wished to proceed.  All questions were answered.  Sterile technique used throughout procedure and epidural site dressed with sterile barrier dressing. No paresthesia or other complications noted.The patient did not experience any signs of intravascular injection such as tinnitus or metallic taste in mouth nor signs of intrathecal spread such as rapid motor block. Please see nursing notes for vital signs.

## 2014-04-09 ENCOUNTER — Encounter (HOSPITAL_COMMUNITY): Payer: Self-pay

## 2014-04-09 ENCOUNTER — Encounter (HOSPITAL_COMMUNITY)
Admission: RE | Disposition: A | Discharge status: Home / Self Care | Payer: Self-pay | Source: Ambulatory Visit | Attending: Obstetrics and Gynecology

## 2014-04-09 SURGERY — Surgical Case
Anesthesia: Epidural | Site: Abdomen

## 2014-04-09 MED ORDER — WITCH HAZEL-GLYCERIN EX PADS: 1.0000 "application " | MEDICATED_PAD | CUTANEOUS | Status: DC | PRN

## 2014-04-09 MED ORDER — CEFAZOLIN SODIUM-DEXTROSE 2-3 GM-% IV SOLR
INTRAVENOUS | Status: DC | PRN
  Administered 2014-04-09: 2 g via INTRAVENOUS

## 2014-04-09 MED ORDER — NALOXONE HCL 1 MG/ML IJ SOLN: 1.0000 ug/kg/h | INTRAVENOUS | Status: DC | PRN

## 2014-04-09 MED ORDER — MEPERIDINE HCL 25 MG/ML IJ SOLN
INTRAMUSCULAR | Status: AC
  Filled 2014-04-09: qty 1

## 2014-04-09 MED ORDER — METOCLOPRAMIDE HCL 5 MG/ML IJ SOLN: 10.0000 mg | Freq: Three times a day (TID) | INTRAMUSCULAR | Status: DC | PRN

## 2014-04-09 MED ORDER — CEFAZOLIN SODIUM-DEXTROSE 2-3 GM-% IV SOLR
INTRAVENOUS | Status: AC
  Filled 2014-04-09: qty 50

## 2014-04-09 MED ORDER — LANOLIN HYDROUS EX OINT: 1.0000 "application " | TOPICAL_OINTMENT | CUTANEOUS | Status: DC | PRN

## 2014-04-09 MED ORDER — LACTATED RINGERS IV SOLN
INTRAVENOUS | Status: DC
  Administered 2014-04-09: 11:00:00 via INTRAVENOUS

## 2014-04-09 MED ORDER — OXYTOCIN 40 UNITS IN LACTATED RINGERS INFUSION - SIMPLE MED: 62.5000 mL/h | INTRAVENOUS | Status: DC

## 2014-04-09 MED ORDER — ONDANSETRON HCL 4 MG/2ML IJ SOLN
INTRAMUSCULAR | Status: DC | PRN
  Administered 2014-04-09: 4 mg via INTRAVENOUS

## 2014-04-09 MED ORDER — MENTHOL 3 MG MT LOZG: 1.0000 | LOZENGE | OROMUCOSAL | Status: DC | PRN

## 2014-04-09 MED ORDER — SODIUM BICARBONATE 8.4 % IV SOLN
INTRAVENOUS | Status: AC
  Filled 2014-04-09: qty 50

## 2014-04-09 MED ORDER — KETOROLAC TROMETHAMINE 30 MG/ML IJ SOLN
30.0000 mg | Freq: Four times a day (QID) | INTRAMUSCULAR | Status: AC | PRN
  Administered 2014-04-09: 30 mg via INTRAVENOUS

## 2014-04-09 MED ORDER — LIDOCAINE-EPINEPHRINE (PF) 2 %-1:200000 IJ SOLN
INTRAMUSCULAR | Status: DC | PRN
  Administered 2014-04-09: 5 mL via EPIDURAL
  Administered 2014-04-09: 10 mL via EPIDURAL

## 2014-04-09 MED ORDER — SCOPOLAMINE 1 MG/3DAYS TD PT72
1.0000 | MEDICATED_PATCH | Freq: Once | TRANSDERMAL | Status: AC
  Administered 2014-04-09: 1.5 mg via TRANSDERMAL

## 2014-04-09 MED ORDER — MORPHINE SULFATE (PF) 0.5 MG/ML IJ SOLN
INTRAMUSCULAR | Status: DC | PRN
  Administered 2014-04-09: 300 ug via EPIDURAL

## 2014-04-09 MED ORDER — ONDANSETRON HCL 4 MG/2ML IJ SOLN
4.0000 mg | INTRAMUSCULAR | Status: DC | PRN
Start: 2014-04-09 — End: 2014-04-12

## 2014-04-09 MED ORDER — MORPHINE SULFATE 0.5 MG/ML IJ SOLN
INTRAMUSCULAR | Status: AC
  Filled 2014-04-09: qty 10

## 2014-04-09 MED ORDER — OXYCODONE-ACETAMINOPHEN 5-325 MG PO TABS
1.0000 | ORAL_TABLET | ORAL | Status: DC | PRN
  Administered 2014-04-10 – 2014-04-12 (×6): 1 via ORAL
  Filled 2014-04-09 (×6): qty 1

## 2014-04-09 MED ORDER — SIMETHICONE 80 MG PO CHEW
80.0000 mg | CHEWABLE_TABLET | ORAL | Status: DC
  Administered 2014-04-10 – 2014-04-11 (×3): 80 mg via ORAL
  Filled 2014-04-09 (×4): qty 1

## 2014-04-09 MED ORDER — DIPHENHYDRAMINE HCL 50 MG/ML IJ SOLN
25.0000 mg | INTRAMUSCULAR | Status: DC | PRN
Start: 2014-04-09 — End: 2014-04-12

## 2014-04-09 MED ORDER — ONDANSETRON HCL 4 MG/2ML IJ SOLN
INTRAMUSCULAR | Status: AC
  Filled 2014-04-09: qty 2

## 2014-04-09 MED ORDER — DIPHENHYDRAMINE HCL 25 MG PO CAPS: 25.0000 mg | ORAL_CAPSULE | ORAL | Status: DC | PRN

## 2014-04-09 MED ORDER — OXYTOCIN 10 UNIT/ML IJ SOLN
40.0000 [IU] | INTRAVENOUS | Status: DC | PRN
  Administered 2014-04-09: 40 [IU] via INTRAVENOUS

## 2014-04-09 MED ORDER — HYDROMORPHONE HCL PF 1 MG/ML IJ SOLN
0.2500 mg | INTRAMUSCULAR | Status: DC | PRN
  Administered 2014-04-09 (×3): 0.5 mg via INTRAVENOUS

## 2014-04-09 MED ORDER — ONDANSETRON HCL 4 MG/2ML IJ SOLN
4.0000 mg | Freq: Three times a day (TID) | INTRAMUSCULAR | Status: DC | PRN
Start: 2014-04-09 — End: 2014-04-12

## 2014-04-09 MED ORDER — SCOPOLAMINE 1 MG/3DAYS TD PT72
MEDICATED_PATCH | TRANSDERMAL | Status: AC
  Filled 2014-04-09: qty 1

## 2014-04-09 MED ORDER — NALBUPHINE HCL 10 MG/ML IJ SOLN: 5.0000 mg | INTRAMUSCULAR | Status: DC | PRN

## 2014-04-09 MED ORDER — ONDANSETRON HCL 4 MG PO TABS: 4.0000 mg | ORAL_TABLET | ORAL | Status: DC | PRN

## 2014-04-09 MED ORDER — MEPERIDINE HCL 25 MG/ML IJ SOLN: 6.2500 mg | INTRAMUSCULAR | Status: DC | PRN

## 2014-04-09 MED ORDER — OXYTOCIN 10 UNIT/ML IJ SOLN
INTRAMUSCULAR | Status: AC
  Filled 2014-04-09: qty 4

## 2014-04-09 MED ORDER — HYDROMORPHONE HCL PF 1 MG/ML IJ SOLN
INTRAMUSCULAR | Status: AC
  Filled 2014-04-09: qty 1

## 2014-04-09 MED ORDER — LIDOCAINE HCL (CARDIAC) 20 MG/ML IV SOLN
INTRAVENOUS | Status: AC
  Filled 2014-04-09: qty 5

## 2014-04-09 MED ORDER — MEPERIDINE HCL 25 MG/ML IJ SOLN
INTRAMUSCULAR | Status: DC | PRN
  Administered 2014-04-09: 25 mg via INTRAVENOUS

## 2014-04-09 MED ORDER — ZOLPIDEM TARTRATE 5 MG PO TABS: 5.0000 mg | ORAL_TABLET | Freq: Every evening | ORAL | Status: DC | PRN

## 2014-04-09 MED ORDER — PRENATAL MULTIVITAMIN CH
1.0000 | ORAL_TABLET | Freq: Every day | ORAL | Status: DC
  Administered 2014-04-10 – 2014-04-12 (×3): 1 via ORAL
  Filled 2014-04-09 (×3): qty 1

## 2014-04-09 MED ORDER — DIBUCAINE 1 % RE OINT: 1.0000 "application " | TOPICAL_OINTMENT | RECTAL | Status: DC | PRN

## 2014-04-09 MED ORDER — SIMETHICONE 80 MG PO CHEW: 80.0000 mg | CHEWABLE_TABLET | ORAL | Status: DC | PRN

## 2014-04-09 MED ORDER — IBUPROFEN 600 MG PO TABS
600.0000 mg | ORAL_TABLET | Freq: Four times a day (QID) | ORAL | Status: DC
  Administered 2014-04-09 – 2014-04-12 (×11): 600 mg via ORAL
  Filled 2014-04-09 (×11): qty 1

## 2014-04-09 MED ORDER — KETOROLAC TROMETHAMINE 30 MG/ML IJ SOLN: 30.0000 mg | Freq: Four times a day (QID) | INTRAMUSCULAR | Status: AC | PRN

## 2014-04-09 MED ORDER — TETANUS-DIPHTH-ACELL PERTUSSIS 5-2.5-18.5 LF-MCG/0.5 IM SUSP
0.5000 mL | Freq: Once | INTRAMUSCULAR | Status: AC
Start: 2014-04-10 — End: 2014-04-10
  Administered 2014-04-10: 0.5 mL via INTRAMUSCULAR
  Filled 2014-04-09: qty 0.5

## 2014-04-09 MED ORDER — KETOROLAC TROMETHAMINE 30 MG/ML IJ SOLN
INTRAMUSCULAR | Status: AC
  Filled 2014-04-09: qty 1

## 2014-04-09 MED ORDER — SENNOSIDES-DOCUSATE SODIUM 8.6-50 MG PO TABS
2.0000 | ORAL_TABLET | ORAL | Status: DC
  Administered 2014-04-10 – 2014-04-11 (×3): 2 via ORAL
  Filled 2014-04-09 (×3): qty 2

## 2014-04-09 MED ORDER — SODIUM CHLORIDE 0.9 % IJ SOLN: 3.0000 mL | INTRAMUSCULAR | Status: DC | PRN

## 2014-04-09 MED ORDER — DIPHENHYDRAMINE HCL 25 MG PO CAPS: 25.0000 mg | ORAL_CAPSULE | Freq: Four times a day (QID) | ORAL | Status: DC | PRN

## 2014-04-09 MED ORDER — NALOXONE HCL 0.4 MG/ML IJ SOLN: 0.4000 mg | INTRAMUSCULAR | Status: DC | PRN

## 2014-04-09 MED ORDER — DIPHENHYDRAMINE HCL 50 MG/ML IJ SOLN: 12.5000 mg | INTRAMUSCULAR | Status: DC | PRN

## 2014-04-09 MED ORDER — PHENYLEPHRINE HCL 10 MG/ML IJ SOLN
INTRAMUSCULAR | Status: DC | PRN
  Administered 2014-04-09: 80 ug via INTRAVENOUS

## 2014-04-09 MED ORDER — SIMETHICONE 80 MG PO CHEW
80.0000 mg | CHEWABLE_TABLET | Freq: Three times a day (TID) | ORAL | Status: DC
  Administered 2014-04-09 – 2014-04-12 (×8): 80 mg via ORAL
  Filled 2014-04-09 (×7): qty 1

## 2014-04-09 SURGICAL SUPPLY — 30 items
ADH SKN CLS APL DERMABOND .7 (GAUZE/BANDAGES/DRESSINGS) ×1
CLAMP CORD UMBIL (MISCELLANEOUS) IMPLANT
CLOTH BEACON ORANGE TIMEOUT ST (SAFETY) ×3 IMPLANT
DERMABOND ADVANCED (GAUZE/BANDAGES/DRESSINGS) ×2
DERMABOND ADVANCED .7 DNX12 (GAUZE/BANDAGES/DRESSINGS) IMPLANT
DRAPE LG THREE QUARTER DISP (DRAPES) IMPLANT
DRSG OPSITE POSTOP 4X10 (GAUZE/BANDAGES/DRESSINGS) ×3 IMPLANT
DURAPREP 26ML APPLICATOR (WOUND CARE) ×3 IMPLANT
ELECT REM PT RETURN 9FT ADLT (ELECTROSURGICAL) ×3
ELECTRODE REM PT RTRN 9FT ADLT (ELECTROSURGICAL) ×1 IMPLANT
EXTRACTOR VACUUM M CUP 4 TUBE (SUCTIONS) IMPLANT
EXTRACTOR VACUUM M CUP 4' TUBE (SUCTIONS)
GLOVE BIO SURGEON STRL SZ7 (GLOVE) ×3 IMPLANT
GOWN STRL REUS W/TWL LRG LVL3 (GOWN DISPOSABLE) ×6 IMPLANT
KIT ABG SYR 3ML LUER SLIP (SYRINGE) IMPLANT
NDL HYPO 25X5/8 SAFETYGLIDE (NEEDLE) IMPLANT
NEEDLE HYPO 25X5/8 SAFETYGLIDE (NEEDLE) IMPLANT
NS IRRIG 1000ML POUR BTL (IV SOLUTION) ×3 IMPLANT
PACK C SECTION WH (CUSTOM PROCEDURE TRAY) ×3 IMPLANT
PAD OB MATERNITY 4.3X12.25 (PERSONAL CARE ITEMS) ×3 IMPLANT
RTRCTR C-SECT PINK 25CM LRG (MISCELLANEOUS) ×3 IMPLANT
STAPLER VISISTAT 35W (STAPLE) IMPLANT
SUT CHROMIC 1 CTX 36 (SUTURE) ×6 IMPLANT
SUT PDS AB 0 CTX 60 (SUTURE) ×3 IMPLANT
SUT VIC AB 2-0 CT1 27 (SUTURE) ×3
SUT VIC AB 2-0 CT1 TAPERPNT 27 (SUTURE) ×1 IMPLANT
SUT VIC AB 4-0 KS 27 (SUTURE) IMPLANT
TOWEL OR 17X24 6PK STRL BLUE (TOWEL DISPOSABLE) ×3 IMPLANT
TRAY FOLEY CATH 14FR (SET/KITS/TRAYS/PACK) ×3 IMPLANT
WATER STERILE IRR 1000ML POUR (IV SOLUTION) ×3 IMPLANT

## 2014-04-09 NOTE — Addendum Note (Signed)
Addendum created 04/09/14 1645 by Ignacia Bayley, CRNA   Modules edited: Notes Section   Notes Section:  File: [id number]

## 2014-04-09 NOTE — Op Note (Signed)
Pre-Operative Diagnosis: 1) 41+2 week Intrauterine pregnancy 2) Maternal chorioamnionitis 3) Failure to descend 4) Non-reassuring fetal well-being 5) Occiput Posterior position Postoperative Diagnosis: Same Procedure: Urgent Primary cesarean section Surgeon: Dr. Vanessa Kick Assistant: None Operative Findings: Female infant in the vertex presentation, light meconium stained fluid.  Specimen: Placenta to pathology EBL: Total I/O In: 1000 [I.V.:1000] Out: 3354 [Urine:675; Blood:700]   Procedure:[name] is an 33 year old gravida 3 para 2002 at 87 weeks and 1 days estimated gestational age who presents for cesarean section. The patient was admitted for a post-term induction of labor. She made slow progression in labor. The fetal tracing would have intermittent late decelerations. Patient spiked a temperature and received amp & gent. She made it to complete and pushed for about 10 minutes with no descent of the fetal head and persistent late decelerations after each contraction and the decision was made to proceed to the OR for an urgent cesarean section.  Following the appropriate informed consent the patient was brought to the operating room where epidural anesthesia was administered and found to be adequate. She was placed in the dorsal supine position with a leftward tilt. She was prepped and draped in the normal sterile fashion. Scalpel was then used to make a Pfannenstiel skin incision which was carried down to the underlying layers of soft tissue to the fascia. The fascia was incised in the midline and the fascial incision was extended laterally with blunt dissection. The rectus muscles were separated in the midline. The abdominal peritoneum was identified and enteredbluntly, and the incision was extended superiorly and inferiorly with good visualization of the bladder. The Alexis retractor was then deployed. Scalpel was then used to make a low transverse incision on the uterus which was extended  laterally with blunt dissection. The fetal vertex was identified, delivered easily through the uterine incision followed by the body. The infant was bulb suctioned on the operative field, cord was clamped and cut and the infant was passed to the waiting neonatologist. Placenta was then delivered spontaneously, the uterus was cleared of all clot and debris. The uterine incision was repaired with #1 chromic in running locked fashion followed by a second imbricating layer. The Alexis retractor was removed. The abdominal cavity was cleared of all clot and debris. The abdominal peritoneum was reapproximated with 2-0 Vicryl in a running fashion, the rectus muscles was reapproximated with #1 chromic in a running fashion. The fascia was closed with a looped PDS in a running fashion. The skin was closed with 4-0 vicryl in a subcuticular fashion and Dermabond. All sponge lap and needle counts were correct x2. Patient tolerated the procedure well and recovered in stable condition following the procedure.

## 2014-04-09 NOTE — Anesthesia Postprocedure Evaluation (Signed)
  Anesthesia Post-op Note  Patient: Jean Cabrera  Procedure(s) Performed: Procedure(s): CESAREAN SECTION (N/A)  Patient is awake, responsive, moving her legs, and has signs of resolution of her numbness. Pain and nausea are reasonably well controlled. Vital signs are stable and clinically acceptable. Oxygen saturation is clinically acceptable. There are no apparent anesthetic complications at this time. Patient is ready for discharge.

## 2014-04-09 NOTE — Transfer of Care (Signed)
Immediate Anesthesia Transfer of Care Note  Patient: Jean Cabrera  Procedure(s) Performed: Procedure(s): CESAREAN SECTION (N/A)  Patient Location: PACU  Anesthesia Type:Epidural  Level of Consciousness: awake, alert  and sedated  Airway & Oxygen Therapy: Patient Spontanous Breathing  Post-op Assessment: Report given to PACU RN and Post -op Vital signs reviewed and stable  Post vital signs: Reviewed and stable  Complications: No apparent anesthesia complications

## 2014-04-09 NOTE — Anesthesia Postprocedure Evaluation (Signed)
  Anesthesia Post-op Note  Patient: Jean Cabrera  Procedure(s) Performed: Procedure(s): CESAREAN SECTION (N/A)  Patient Location: [facility]  Anesthesia Type:Epidural  Level of Consciousness: awake  Airway and Oxygen Therapy: Patient Spontanous Breathing  Post-op Pain: mild  Post-op Assessment: Patient's Cardiovascular Status Stable and Respiratory Function Stable  Post-op Vital Signs: stable  Last Vitals:  Filed Vitals:   04/09/14 1515  BP: 109/66  Pulse: 69  Temp: 36.5 C  Resp: 18    Complications: No apparent anesthesia complications

## 2014-04-10 LAB — CBC
HEMATOCRIT: 29.4 % — AB (ref 36.0–46.0)
HEMOGLOBIN: 9.3 g/dL — AB (ref 12.0–15.0)
MCH: 27 pg (ref 26.0–34.0)
MCHC: 31.6 g/dL (ref 30.0–36.0)
MCV: 85.5 fL (ref 78.0–100.0)
Platelets: 224 10*3/uL (ref 150–400)
RBC: 3.44 MIL/uL — AB (ref 3.87–5.11)
RDW: 15.3 % (ref 11.5–15.5)
WBC: 12.1 10*3/uL — AB (ref 4.0–10.5)

## 2014-04-10 NOTE — Progress Notes (Signed)
S.: Patient comfortable after epidural O: AFVSS Cervix: 4/50/-3 FHR 140s reactive toco irregular  AROM, thin mec  A/P  1) cont pit 2) FWB reassuring

## 2014-04-10 NOTE — Lactation Note (Signed)
This note was copied from the chart of [name]. Lactation Consultation Note: Called to assist mom- who is asking about a NS . When I got there she had baby latched to breast without NS. Experienced BF mom. Suggested bring manual pump with her to pump for a few minutes to erect nipple before latching. States she is having trouble on left breast but baby is awake but not showing signs of hunger. No further questions at present,.  Patient Name: Jean Cabrera [id number] Date: 04/10/2014 Reason for consult: Follow-up assessment   Maternal Data    Feeding Feeding Type: Breast Fed Nipple Type: Slow - flow Length of feed: 20 min  LATCH Score/Interventions Latch: Grasps breast easily, tongue down, lips flanged, rhythmical sucking.  Audible Swallowing: A few with stimulation  Type of Nipple: Everted at rest and after stimulation  Comfort (Breast/Nipple): Soft / non-tender     Hold (Positioning): No assistance needed to correctly position infant at breast.  LATCH Score: 9  Lactation Tools Discussed/Used     Consult Status Consult Status: Follow-up Date: 04/11/14 Follow-up type: In-patient    Truddie Crumble 04/10/2014, 3:41 PM

## 2014-04-10 NOTE — H&P (Signed)
Jean Cabrera is a 33 y.o. female presenting for IOL for postdates  33 year old G71 P 2012 [place] female who presents at 90 weeks and 1 day for a post dates induction of labor. Her pregnancy has been uncomplicated to this point History OB History   Grav Para Term Preterm Abortions TAB SAB Ect Mult Living   4 3 3  1 1  0  1 3     Past Medical History  Diagnosis Date  . Abnormal Pap smear   . Anemia   . Uterine fibroids affecting pregnancy   . Late prenatal care     13 weeks  . Myringotomy tube status    Past Surgical History  Procedure Laterality Date  . Colposcopy    . Dilation and curettage of uterus     Family History: family history includes Alcohol abuse in her father and paternal aunt; COPD in her paternal aunt; Drug abuse in her father and paternal aunt; Mental illness in her cousin; Thyroid disease in her paternal aunt. Social History:  reports that she has never smoked. She has never used smokeless tobacco. She reports that she does not drink alcohol or use illicit drugs.   Prenatal Transfer Tool  Maternal Diabetes: No Genetic Screening: Declined Maternal Ultrasounds/Referrals: Normal Fetal Ultrasounds or other Referrals:  None Maternal Substance Abuse:  No Significant Maternal Medications:  None Significant Maternal Lab Results:  None Other Comments:  None  ROS: as above  Dilation: 10 Effacement (%): 90 Station: -1;0 Exam by:: Dr Harrington Challenger Blood pressure 126/75, pulse 56, temperature 97.4 F (36.3 C), temperature source Oral, resp. rate 18, height 5' 3.5" (1.613 m), weight 93.895 kg (207 lb), SpO2 100.00%, unknown if currently breastfeeding. Exam Physical Exam  Prenatal labs: ABO, Rh: --/--/O POS (07/03 0755) Antibody: NEG (07/03 0755) Rubella: Immune (12/02 0000) RPR: NON REAC (07/03 0755)  HBsAg: Negative (12/02 0000)  HIV: Non-reactive (12/02 0000)  GBS: Negative (05/20 0000)   Assessment/Plan: 1) Admit 2) Epidural on request 3) AROM when  able 4) Pitocin for augmentation   [name] 04/10/2014, 12:10 PM

## 2014-04-10 NOTE — Progress Notes (Signed)
S: Patient remains comfortable with epidural, she expresses some concern about the duration of the induction. She states this is the longest it ever been in labor. O: Temp 100.5 @ 22:30 CVX 8/70/-2 FHR 155 with early variables and occassional lates toco Q2-3  A/P amp, gent, Tylenol for maternal fever Overall fetal well-being reassuring Discussed some concern for cephalopelvic disproportion given the length of time that it has taken for the patient to reach full dilation and that the station of the vertex is still -2 Patient and her husband referring her desire for all measures to be exhausted to achieve a vaginal delivery.

## 2014-04-10 NOTE — Progress Notes (Signed)
Subjective: Postpartum Day 1: Cesarean Delivery Patient reports pain controlled, no nausea. Ambulating well  Objective: Vital signs in last 24 hours: Temp:  [97.4 F (36.3 C)-97.8 F (36.6 C)] 97.4 F (36.3 C) (07/05 0630) Pulse Rate:  [56-70] 56 (07/05 0630) Resp:  [18] 18 (07/05 0630) BP: (104-130)/(61-82) 126/75 mmHg (07/05 0630) SpO2:  [98 %-100 %] 100 % (07/05 0630)  Physical Exam:  General: alert, cooperative and appears stated age Lochia: appropriate Uterine Fundus: firm Incision: healing well DVT Evaluation: No evidence of DVT seen on physical exam.   Recent Labs  04/08/14 0755 04/10/14 0519  HGB 12.2 9.3*  HCT 38.0 29.4*    Assessment/Plan: Status post Cesarean section. Doing well postoperatively.  Continue current care. Baby in NICU for sepsis work-up  Cara Aguino H. 04/10/2014, 11:57 AM

## 2014-04-10 NOTE — Progress Notes (Signed)
S: Still comfortable with epidural O: afebrile vital signs stable Cervix: 6-7 cm/60% effaced/-2 station Fetal heart tone 1:30 with early-appearing variables, occasional late decelerations Toco: Irregular  IUPC placed without difficulty  A/P Continue induction with Pitocin Overall fetal well-being reassuring

## 2014-04-10 NOTE — Progress Notes (Signed)
S: Feeling some pressure O: Still febrile Cvx anterior rim, complete, 0 station FHR 150s with variable and lates toco Q 2-3  Discussed with patient that we'll try to reduce her cervix and attempt at pushing. The patient understands that he may not tolerate pushing. We will see if she can bring the baby down with pushing if not we will need to proceed with cesarean

## 2014-04-10 NOTE — Plan of Care (Signed)
Problem: Discharge Progression Outcomes Goal: Activity appropriate for discharge plan Outcome: Completed/Met Date Met:  04/10/14 Able to tolerate walking to NICU well. Goal: Tolerating diet Outcome: Completed/Met Date Met:  04/10/14 Tolerating a Regular diet well. Goal: Complications resolved/controlled Outcome: Completed/Met Date Met:  04/10/14 Voiding without difficulty and passing flatus. Goal: Pain controlled with appropriate interventions Outcome: Completed/Met Date Met:  04/10/14 Good pain control on po Motrin and Percocet.

## 2014-04-11 ENCOUNTER — Encounter (HOSPITAL_COMMUNITY): Payer: Self-pay | Admitting: Obstetrics and Gynecology

## 2014-04-11 DIAGNOSIS — Z051 Observation and evaluation of newborn for suspected infectious condition ruled out: Secondary | ICD-10-CM

## 2014-04-11 DIAGNOSIS — Z9189 Other specified personal risk factors, not elsewhere classified: Secondary | ICD-10-CM

## 2014-04-11 MED ORDER — ALUM & MAG HYDROXIDE-SIMETH 200-200-20 MG/5ML PO SUSP: 30.0000 mL | ORAL | Status: DC | PRN

## 2014-04-11 MED ORDER — BISACODYL 5 MG PO TBEC
5.0000 mg | DELAYED_RELEASE_TABLET | Freq: Every day | ORAL | Status: DC | PRN
  Administered 2014-04-11: 5 mg via ORAL
  Filled 2014-04-11: qty 1

## 2014-04-11 NOTE — Progress Notes (Signed)
Ur chart review completed.  

## 2014-04-11 NOTE — Progress Notes (Signed)
Subjective: Postpartum Day 2: Cesarean Delivery Patient reports incisional pain, tolerating PO and + flatus, but feels bloated and has "gas pain".    Objective: Vital signs in last 24 hours: Temp:  [97.6 F (36.4 C)-98.3 F (36.8 C)] 98.3 F (36.8 C) (07/06 1200) Pulse Rate:  [71-82] 82 (07/06 1200) Resp:  [16-18] 18 (07/06 1200) BP: (117-124)/(68-76) 118/73 mmHg (07/06 1200) SpO2:  [100 %] 100 % (07/06 0511)  Physical Exam:  General: alert, cooperative and no distress Lochia: appropriate Abd: Soft, appropriately tender to palpation, some distention Uterine Fundus: firm Incision: no significant drainage, no dehiscence, no significant erythema DVT Evaluation: No evidence of DVT seen on physical exam. Negative Homan's sign. No cords or calf tenderness.   Recent Labs  04/10/14 0519  HGB 9.3*  HCT 29.4*    Assessment/Plan: Status post Cesarean section. Doing well postoperatively.  Continue current care. Will add Mylanta to regimen for gas, will give dulcolax for patient to have BM. Baby in [place], [place] 04/11/2014, 1:26 PM

## 2014-04-11 NOTE — Lactation Note (Signed)
This note was copied from the chart of [place]. Lactation Consultation Note     Follow up brief consult with this mom of a term NICU baby, now 27 hours old, and 41 5/7 weeks CGA. Mom is an experienced breast feeder, and was on her way to the nICU to breast feed. I asked if she wanted me to come and assist her with latching, and she said no, she would be fine. Mom has been pumping, until she stops dripping, and is also breast feeding 6  Times  A day. Mom knows to call for questions/concenrs.  Patient Name: Jean Cabrera [id number] Date: 04/11/2014 Reason for consult: Follow-up assessment;NICU baby   Maternal Data    Feeding Feeding Type: Breast Fed Length of feed: 20 min  LATCH Score/Interventions                      Lactation Tools Discussed/Used     Consult Status Consult Status: PRN Follow-up type: In-patient (NICU)    Tonna Corner 04/11/2014, 10:47 AM

## 2014-04-12 LAB — BASIC METABOLIC PANEL
Anion gap: 11 (ref 5–15)
BUN: 7 mg/dL (ref 6–23)
CHLORIDE: 100 meq/L (ref 96–112)
CO2: 27 mEq/L (ref 19–32)
Calcium: 9.6 mg/dL (ref 8.4–10.5)
Creatinine, Ser: 0.69 mg/dL (ref 0.50–1.10)
GFR calc Af Amer: >90 mL/min (ref >90)
GFR calc non Af Amer: >90 mL/min (ref >90)
Glucose, Bld: 91 mg/dL (ref 70–99)
POTASSIUM: 3.9 meq/L (ref 3.7–5.3)
SODIUM: 138 meq/L (ref 137–147)

## 2014-04-12 NOTE — Progress Notes (Signed)
Pt discharged home with husband... Discharge instructions reviewed with pt and she verbalized understanding... Condition stable... No equipment... Pt in NICU visiting with baby and will leave to go home from there as baby is being discharged too.

## 2014-04-12 NOTE — Progress Notes (Signed)
POD#3 Pt without complaints. Waiting for NICU to decide if baby will be discharged to home.

## 2014-04-12 NOTE — Discharge Summary (Signed)
Obstetric Discharge Summary Reason for Admission: induction of labor Prenatal Procedures: ultrasound Intrapartum Procedures: cesarean: low cervical, transverse Postpartum Procedures: none Complications-Operative and Postpartum: none Hemoglobin  Date Value Ref Range Status  04/10/2014 9.3* 12.0 - 15.0 g/dL Final     DELTA CHECK NOTED     REPEATED TO VERIFY     HCT  Date Value Ref Range Status  04/10/2014 29.4* 36.0 - 46.0 % Final    Physical Exam:  General: alert Lochia: appropriate Uterine Fundus: firm Incision: healing well DVT Evaluation: No evidence of DVT seen on physical exam.  Discharge Diagnoses: Term Pregnancy-delivered, Amnionitis and Failed induction  Discharge Information: Date: 04/12/2014 Activity: pelvic rest Diet: routine Medications: PNV and Ibuprofen Condition: stable Instructions: refer to practice specific booklet Discharge to: home Follow-up Information   Follow up with Marcial Pacas., MD. Schedule an appointment as soon as possible for a visit in 1 month.   Specialty:  Obstetrics and Gynecology   Contact information:   [address] [place] [address] [phone]       Newborn Data:   Paiden, Caraveo [[id number]]  Live born unspecified sex  Birth Weight:  APGAR: ,    Morine, Kohlman [[id number]]  Live born female  Birth Weight: 8 lb 6.8 oz (3820 g) APGAR: 2, 1  Home with mother.  ANDERSON,MARK E 04/12/2014, 4:17 PM

## 2014-04-18 ENCOUNTER — Encounter (HOSPITAL_COMMUNITY): Payer: Self-pay | Admitting: Obstetrics and Gynecology

## 2014-04-18 NOTE — Addendum Note (Signed)
Addendum created 04/18/14 2297 by Lyndle Herrlich, MD   Modules edited: Anesthesia Events

## 2014-08-08 ENCOUNTER — Encounter (HOSPITAL_COMMUNITY): Payer: Self-pay | Admitting: Obstetrics and Gynecology

## 2015-07-05 ENCOUNTER — Other Ambulatory Visit: Payer: Self-pay | Admitting: Obstetrics and Gynecology

## 2015-07-07 LAB — CYTOLOGY - PAP

## 2016-03-12 ENCOUNTER — Other Ambulatory Visit: Payer: Self-pay | Admitting: Obstetrics and Gynecology

## 2016-03-12 DIAGNOSIS — N63 Unspecified lump in unspecified breast: Secondary | ICD-10-CM

## 2016-03-15 ENCOUNTER — Other Ambulatory Visit: Payer: Self-pay

## 2016-03-18 ENCOUNTER — Ambulatory Visit (HOSPITAL_COMMUNITY)
Admission: EM | Admit: 2016-03-18 | Discharge: 2016-03-18 | Disposition: A | Discharge status: Home / Self Care | Payer: Self-pay | Attending: Family Medicine | Admitting: Family Medicine

## 2016-03-18 ENCOUNTER — Encounter (HOSPITAL_COMMUNITY): Payer: Self-pay | Admitting: Emergency Medicine

## 2016-03-18 ENCOUNTER — Ambulatory Visit (INDEPENDENT_AMBULATORY_CARE_PROVIDER_SITE_OTHER): Payer: Self-pay

## 2016-03-18 DIAGNOSIS — S80212A Abrasion, left knee, initial encounter: Secondary | ICD-10-CM

## 2016-03-18 NOTE — ED Notes (Signed)
Patient fell today, wearing wedges and walked from tile to carpeted area, reported as uneven from one flooring to another flooring.  Reports pain and abrasion to left knee.left elbow also feels different.

## 2016-03-18 NOTE — ED Provider Notes (Signed)
CSN: [id number]     Arrival date & time 03/18/16  1419 History   First MD Initiated Contact with Patient 03/18/16 1620     Chief Complaint  Patient presents with  . Fall   (Consider location/radiation/quality/duration/timing/severity/associated sxs/prior Treatment) HPI History obtained from patient:  Pt presents with the cc of:  Left knee injury Duration of symptoms: today Treatment prior to arrival: none Context: tripped and fell while at [employer] Other symptoms include: elbow feels funny Pain score: 1 FAMILY HISTORY: etoh and drug abuse-father      Past Medical History  Diagnosis Date  . Abnormal Pap smear   . Anemia   . Uterine fibroids affecting pregnancy   . Late prenatal care     13 weeks  . Myringotomy tube status    Past Surgical History  Procedure Laterality Date  . Colposcopy    . Dilation and curettage of uterus    . Cesarean section N/A 04/09/2014    Procedure: CESAREAN SECTION;  Surgeon: Farrel Gobble. Harrington Challenger, MD;  Location: [facility];  Service: Obstetrics;  Laterality: N/A;   Family History  Problem Relation Age of Onset  . Thyroid disease Paternal Aunt   . COPD Paternal Aunt     thyroid cancer  . Alcohol abuse Paternal Aunt   . Drug abuse Paternal Aunt   . Mental illness Cousin     bipolar  . Alcohol abuse Father   . Drug abuse Father    Social History  Substance Use Topics  . Smoking status: Never Smoker   . Smokeless tobacco: Never Used  . Alcohol Use: No   OB History    Gravida Para Term Preterm AB TAB SAB Ectopic Multiple Living   4 3 3  1 1  0  1 3     Review of Systems  Denies: HEADACHE, NAUSEA, ABDOMINAL PAIN, CHEST PAIN, CONGESTION, DYSURIA, SHORTNESS OF BREATH  Allergies  Blueberry; Kiwi extract; and Latex  Home Medications   Prior to Admission medications   Medication Sig Start Date End Date Taking? Authorizing Provider  Prenatal Vit-Fe Fumarate-FA (PRENATAL MULTIVITAMIN) TABS Take 1 tablet by mouth daily.    Historical Provider,  MD   Meds Ordered and Administered this Visit  Medications - No data to display  BP 119/67 mmHg  Pulse 95  Temp(Src) 98.7 F (37.1 C) (Oral)  Resp 18  SpO2 98%  LMP 02/19/2016 No data found.   Physical Exam NURSES NOTES AND VITAL SIGNS REVIEWED. CONSTITUTIONAL: Well developed, well nourished, no acute distress HEENT: normocephalic, atraumatic EYES: Conjunctiva normal NECK:normal ROM, supple, no adenopathy PULMONARY:No respiratory distress, normal effort ABDOMINAL: Soft, ND, NT BS+, No CVAT MUSCULOSKELETAL: Normal ROM of all extremities, small abrasion noted on anterior of patella, no swelling, no effusion.   SKIN: warm and dry without rash PSYCHIATRIC: Mood and affect, behavior are normal  ED Course  Procedures (including critical care time)  Labs Review Labs Reviewed - No data to display  Imaging Review Dg Knee Complete 4 Views Left  03/18/2016  CLINICAL DATA:  Fall today with knee pain, initial encounter EXAM: LEFT KNEE - COMPLETE 4+ VIEW COMPARISON:  None. FINDINGS: No evidence of fracture, dislocation, or joint effusion. No evidence of arthropathy or other focal bone abnormality. Soft tissues are unremarkable. IMPRESSION: No acute abnormality noted. Electronically Signed   By: Inez Catalina M.D.   On: 03/18/2016 16:50   Reviewed with patient and is part of MDM process.   Visual Acuity Review  Right Eye Distance:  Left Eye Distance:   Bilateral Distance:    Right Eye Near:   Left Eye Near:    Bilateral Near:       Expect full recovery from this accident.  Suggest symptomatic treatment at home.   MDM   1. Abrasion of knee, left, initial encounter     Patient is reassured that there are no issues that require transfer to higher level of care at this time or additional tests. Patient is advised to continue home symptomatic treatment. Patient is advised that if there are new or worsening symptoms to attend the emergency department, contact primary care  provider, or return to UC. Instructions of care provided discharged home in stable condition.    THIS NOTE WAS GENERATED USING A VOICE RECOGNITION SOFTWARE PROGRAM. ALL REASONABLE EFFORTS  WERE MADE TO PROOFREAD THIS DOCUMENT FOR ACCURACY.  I have verbally reviewed the discharge instructions with the patient. A printed AVS was given to the patient.  All questions were answered prior to discharge.      Konrad Felix, [place] 03/18/16 1916

## 2016-03-18 NOTE — Discharge Instructions (Signed)
Abrasion °An abrasion is a cut or scrape on the surface of your skin. An abrasion does not go through all of the layers of your skin. It is important to take good care of your abrasion to prevent infection. °HOME CARE °Medicines °· Take or apply medicines only as told by your doctor. °· If you were prescribed an antibiotic ointment, finish all of it even if you start to feel better. °Wound Care °· Clean the wound with mild soap and water 2-3 times per day or as told by your doctor. Pat your wound dry with a clean towel. Do not rub it. °· There are many ways to close and cover a wound. Follow instructions from your doctor about: °¨ How to take care of your wound. °¨ When and how you should change your bandage (dressing). °¨ When and how you should take off your dressing. °· Check your wound every day for signs of infection. Watch for: °¨ Redness, swelling, or pain. °¨ Fluid, blood, or pus. °General Instructions °· Keep the dressing dry as told by your doctor. Do not take baths, swim, use a hot tub, or do anything that would put your wound underwater until your doctor says it is okay. °· If there is swelling, raise (elevate) the injured area above the level of your heart while you are sitting or lying down. °· Keep all follow-up visits as told by your doctor. This is important. °GET HELP IF: °· You were given a tetanus shot and you have any of these where the needle went in: °¨ Swelling. °¨ Very bad pain. °¨ Redness. °¨ Bleeding. °· Medicine does not help your pain. °· You have any of these at the site of the wound: °¨ More redness. °¨ More swelling. °¨ More pain. °GET HELP RIGHT AWAY IF: °· You have a red streak going away from your wound. °· You have a fever. °· You have fluid, blood, or pus coming from your wound. °· There is a bad smell coming from your wound. °  °This information is not intended to replace advice given to you by your health care provider. Make sure you discuss any questions you have with your  health care provider. °  °Document Released: 03/11/2008 Document Revised: 02/07/2015 Document Reviewed: 09/21/2014 °Elsevier Interactive Patient Education ©2016 [employer] Inc[employer] ° °

## 2016-03-25 ENCOUNTER — Other Ambulatory Visit: Payer: Self-pay | Admitting: Obstetrics and Gynecology

## 2016-03-25 ENCOUNTER — Ambulatory Visit
Admission: RE | Admit: 2016-03-25 | Discharge: 2016-03-25 | Disposition: A | Discharge status: Home / Self Care | Payer: 59 | Source: Ambulatory Visit | Attending: Obstetrics and Gynecology | Admitting: Obstetrics and Gynecology

## 2016-03-25 DIAGNOSIS — N6310 Unspecified lump in the right breast, unspecified quadrant: Secondary | ICD-10-CM

## 2016-03-25 DIAGNOSIS — N63 Unspecified lump in unspecified breast: Secondary | ICD-10-CM

## 2016-05-23 ENCOUNTER — Other Ambulatory Visit: Payer: Self-pay | Admitting: Obstetrics and Gynecology

## 2016-05-23 LAB — OB RESULTS CONSOLE ABO/RH: RH Type: POSITIVE

## 2016-05-23 LAB — OB RESULTS CONSOLE GC/CHLAMYDIA
Chlamydia: NEGATIVE
Gonorrhea: NEGATIVE

## 2016-05-23 LAB — OB RESULTS CONSOLE RPR: RPR: NONREACTIVE

## 2016-05-23 LAB — OB RESULTS CONSOLE HIV ANTIBODY (ROUTINE TESTING): HIV: NONREACTIVE

## 2016-05-23 LAB — OB RESULTS CONSOLE HEPATITIS B SURFACE ANTIGEN: HEP B S AG: NEGATIVE

## 2016-05-23 LAB — OB RESULTS CONSOLE RUBELLA ANTIBODY, IGM: RUBELLA: IMMUNE

## 2016-10-07 NOTE — L&D Delivery Note (Signed)
Jean Cabrera is a 36 y.o. [phone] who had a prolonged labor. After multiple position changes patient rapidly dilated. Dr. Rip Harbour called to room for standby. She underwent a NSVD of a viable female infant. NICU called to room APGARS 5/9. Placenta delivered complete and intact. EBL 250cc. Perineum intact.   Anesthesia: Epidural Perineum: intact Suture: none EBL: 250 cc Baby: couplet care  Jean Cabrera  4:05 PM 11/18/16

## 2016-10-08 LAB — OB RESULTS CONSOLE GBS: STREP GROUP B AG: NEGATIVE

## 2016-10-08 LAB — OB RESULTS CONSOLE GC/CHLAMYDIA
Chlamydia: NEGATIVE
Gonorrhea: NEGATIVE

## 2016-10-19 ENCOUNTER — Inpatient Hospital Stay (HOSPITAL_COMMUNITY)
Admission: AD | Admit: 2016-10-19 | Discharge: 2016-10-19 | Disposition: A | Discharge status: Home / Self Care | Payer: 59 | Source: Ambulatory Visit | Attending: Family Medicine | Admitting: Family Medicine

## 2016-10-19 ENCOUNTER — Encounter (HOSPITAL_COMMUNITY): Payer: Self-pay | Admitting: *Deleted

## 2016-10-19 DIAGNOSIS — N611 Abscess of the breast and nipple: Secondary | ICD-10-CM | POA: Insufficient documentation

## 2016-10-19 DIAGNOSIS — Z9889 Other specified postprocedural states: Secondary | ICD-10-CM | POA: Insufficient documentation

## 2016-10-19 DIAGNOSIS — O91113 Abscess of breast associated with pregnancy, third trimester: Secondary | ICD-10-CM

## 2016-10-19 DIAGNOSIS — Z825 Family history of asthma and other chronic lower respiratory diseases: Secondary | ICD-10-CM | POA: Diagnosis not present

## 2016-10-19 DIAGNOSIS — N6489 Other specified disorders of breast: Secondary | ICD-10-CM | POA: Diagnosis not present

## 2016-10-19 DIAGNOSIS — Z811 Family history of alcohol abuse and dependence: Secondary | ICD-10-CM | POA: Insufficient documentation

## 2016-10-19 DIAGNOSIS — Z888 Allergy status to other drugs, medicaments and biological substances status: Secondary | ICD-10-CM | POA: Diagnosis not present

## 2016-10-19 DIAGNOSIS — Z808 Family history of malignant neoplasm of other organs or systems: Secondary | ICD-10-CM | POA: Insufficient documentation

## 2016-10-19 DIAGNOSIS — O26893 Other specified pregnancy related conditions, third trimester: Secondary | ICD-10-CM | POA: Diagnosis present

## 2016-10-19 DIAGNOSIS — N644 Mastodynia: Secondary | ICD-10-CM | POA: Diagnosis not present

## 2016-10-19 DIAGNOSIS — Z3A34 34 weeks gestation of pregnancy: Secondary | ICD-10-CM | POA: Insufficient documentation

## 2016-10-19 DIAGNOSIS — Z79899 Other long term (current) drug therapy: Secondary | ICD-10-CM | POA: Diagnosis not present

## 2016-10-19 DIAGNOSIS — N6310 Unspecified lump in the right breast, unspecified quadrant: Secondary | ICD-10-CM | POA: Diagnosis not present

## 2016-10-19 MED ORDER — CEPHALEXIN 500 MG PO CAPS: 500.0000 mg | ORAL_CAPSULE | Freq: Four times a day (QID) | ORAL | 0 refills | Status: DC

## 2016-10-19 NOTE — MAU Note (Signed)
Has known milk cyst for past few months.  Pain is worst today with swelling.  Noticed cyst while nursing but baby stopped nursing completed in Aug.  Area is peeling and seems to be spreading.  Put neosporin on the area but no other drainage.  Putting heat and cold but neither made it better.  Took Ibuprofen last month but the rebound pain was worse

## 2016-10-19 NOTE — Progress Notes (Signed)
Dr Baron Sane notified

## 2016-10-19 NOTE — MAU Provider Note (Signed)
History     CSN: [id number]  Arrival date and time: 10/19/16 1356   First Provider Initiated Contact with Patient 10/19/16 1624      Chief Complaint  Patient presents with  . Breast Pain   Patient is a 36 year old G5 P 3 at 34 weeks and 5 days who presents today with right breast pain. She reports she had a swelling in this area early in the pregnancy was seen at the breast center it was biopsied and she was told it was a galactocele. Resolved and she had no further issues until about 3-4 days ago. At that time she began to notice swelling in her right breast in area became red and started peeling and then the area around it became hard. She's had no discharge. She denies fevers or chills. She does report it is very painful however. When she had an issue previously it was nothing like this. She does report chronic nausea this pregnancy. She treats with herbal tea.    OB History    Gravida Para Term Preterm AB Living   5 3 3   1 3    SAB TAB Ectopic Multiple Live Births   0 1   1 3       Past Medical History:  Diagnosis Date  . Abnormal Pap smear   . Anemia   . Late prenatal care    13 weeks  . Myringotomy tube status   . Uterine fibroids affecting pregnancy     Past Surgical History:  Procedure Laterality Date  . CESAREAN SECTION N/A 04/09/2014   Procedure: CESAREAN SECTION;  Surgeon: Farrel Gobble. Harrington Challenger, MD;  Location: [facility];  Service: Obstetrics;  Laterality: N/A;  . COLPOSCOPY    . DILATION AND CURETTAGE OF UTERUS    . tubes in ear      Family History  Problem Relation Age of Onset  . Thyroid disease Paternal Aunt   . COPD Paternal Aunt     thyroid cancer  . Alcohol abuse Paternal Aunt   . Drug abuse Paternal Aunt   . Mental illness Cousin     bipolar  . Alcohol abuse Father   . Drug abuse Father     Social History  Substance Use Topics  . Smoking status: Never Smoker  . Smokeless tobacco: Never Used  . Alcohol use No    Allergies:  Allergies  Allergen  Reactions  . 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    . Latex Itching    Prescriptions Prior to Admission  Medication Sig Dispense Refill Last Dose  . Prenatal Vit-Fe Phos-FA-Omega (VITAFOL GUMMIES) 3.33-0.333-34.8 MG CHEW Chew 3 each by mouth at bedtime.   10/18/2016 at Unknown time    Review of Systems  Constitutional: Negative for activity change, appetite change, chills and fever.  Respiratory: Negative for shortness of breath and wheezing.   Cardiovascular: Negative for palpitations and leg swelling.  Gastrointestinal: Positive for nausea and vomiting. Negative for abdominal distention, abdominal pain, constipation and diarrhea.  Genitourinary: Negative for difficulty urinating, dysuria and frequency.  Neurological: Negative for dizziness.   Physical Exam   Blood pressure 127/74, pulse 104, temperature 98.9 F (37.2 C), temperature source Oral, resp. rate 18, height 5' 3.5" (1.613 m), weight 208 lb 1.3 oz (94.4 kg), last menstrual period 02/19/2016, unknown if currently breastfeeding.  Physical Exam  Constitutional: She appears well-developed and well-nourished.  Cardiovascular: Normal rate and intact distal pulses.   Respiratory: Effort  normal. No respiratory distress.    GI: Soft. She exhibits no distension. There is no tenderness. There is no rebound and no guarding.  Skin: Skin is warm.  Psychiatric: She has a normal mood and affect. Her behavior is normal.    MAU Course  Procedures  MDM In MA U patient was placed on the monitor and found to have a reactive NST. Her breast was examined and found have an area of fluctuance of surrounding induration. With examination her breast did began to have leakage of purulent fluid from the erythematous area. Culture was collected.  Assessment and Plan  Breast abscess: Patient with breast abscess drained spontaneously in MA U with examination. Plan for Keflex 500 mg 4 times a day for 14 days. If  patient is not improved may need formal drainage at breast center. Patient has appointment on Tuesday for follow-up.  Jacquiline Doe 10/19/2016, 5:09 PM

## 2016-10-19 NOTE — Discharge Instructions (Signed)
Mastitis Mastitis is inflammation of the breast tissue. It occurs most often in women who are breastfeeding, but it can also affect other women, and even sometimes men. CAUSES  Mastitis is usually caused by a bacterial infection. Bacteria enter the breast tissue through cuts or openings in the skin. Typically, this occurs with breastfeeding because of cracked or irritated skin. Sometimes, it can occur even when there is no opening in the skin. It can be associated with plugged milk (lactiferous) ducts. Nipple piercing can also lead to mastitis. Also, some forms of breast cancer can cause mastitis. SIGNS AND SYMPTOMS   Swelling, redness, tenderness, and pain in an area of the breast.  Swelling of the glands under the arm on the same side.  Fever. If an infection is allowed to progress, a collection of pus (abscess) may develop. DIAGNOSIS  Your health care provider can usually diagnose mastitis based on your symptoms and a physical exam. Tests may be done to help confirm the diagnosis. These may include:   Removal of pus from the breast by applying pressure to the area. This pus can be examined in the lab to determine which bacteria are present. If an abscess has developed, the fluid in the abscess can be removed with a needle. This can also be used to confirm the diagnosis and determine the bacteria present. In most cases, pus will not be present.  Blood tests to determine if your body is fighting a bacterial infection.  Mammogram or ultrasound tests to rule out other problems or diseases. TREATMENT  Antibiotic medicine is used to treat a bacterial infection. Your health care provider will determine which bacteria are most likely causing the infection and will select an appropriate antibiotic. This is sometimes changed based on the results of tests performed to identify the bacteria, or if there is no response to the antibiotic selected. Antibiotics are usually given by mouth. You may also be  given medicine for pain. Mastitis that occurs with breastfeeding will sometimes go away on its own, so your health care provider may choose to wait 24 hours after first seeing you to decide whether a prescription medicine is needed. HOME CARE INSTRUCTIONS   Only take over-the-counter or prescription medicines for pain, fever, or discomfort as directed by your health care provider.  If your health care provider prescribed an antibiotic, take the medicine as directed. Make sure you finish it even if you start to feel better.  Do not wear a tight or underwire bra. Wear a soft, supportive bra.  Increase your fluid intake, especially if you have a fever.  Women who are breastfeeding should follow these instructions:  Continue to empty the breast. Your health care provider can tell you whether this milk is safe for your infant or needs to be thrown out. You may be told to stop nursing until your health care provider thinks it is safe for your baby. Use a breast pump if you are advised to stop nursing.  Keep your nipples clean and dry.  Empty the first breast completely before going to the other breast. If your baby is not emptying your breasts completely for some reason, use a breast pump to empty your breasts.  If you go back to work, pump your breasts while at work to stay in time with your nursing schedule.  Avoid allowing your breasts to become overly filled with milk (engorged). SEEK MEDICAL CARE IF:   You have pus-like discharge from the breast.  Your symptoms do  not improve with the treatment prescribed by your health care provider within 2 days. SEEK IMMEDIATE MEDICAL CARE IF:   Your pain and swelling are getting worse.  You have pain that is not controlled with medicine.  You have a red line extending from the breast toward your armpit.  You have a fever or persistent symptoms for more than 2-3 days.  You have a fever and your symptoms suddenly get worse. This information is  not intended to replace advice given to you by your health care provider. Make sure you discuss any questions you have with your health care provider. Document Released: 09/23/2005 Document Revised: 09/28/2013 Document Reviewed: 04/23/2013 Elsevier Interactive Patient Education  2017 [employer].

## 2016-10-22 DIAGNOSIS — N61 Mastitis without abscess: Secondary | ICD-10-CM | POA: Diagnosis not present

## 2016-10-23 LAB — AEROBIC CULTURE  (SUPERFICIAL SPECIMEN)

## 2016-10-23 LAB — AEROBIC CULTURE W GRAM STAIN (SUPERFICIAL SPECIMEN)

## 2016-10-24 ENCOUNTER — Encounter (HOSPITAL_COMMUNITY): Payer: Self-pay

## 2016-10-24 ENCOUNTER — Inpatient Hospital Stay (HOSPITAL_COMMUNITY)
Admission: AD | Admit: 2016-10-24 | Discharge: 2016-10-24 | Disposition: A | Discharge status: Home / Self Care | Payer: 59 | Source: Ambulatory Visit | Attending: Obstetrics & Gynecology | Admitting: Obstetrics & Gynecology

## 2016-10-24 DIAGNOSIS — Z825 Family history of asthma and other chronic lower respiratory diseases: Secondary | ICD-10-CM | POA: Diagnosis not present

## 2016-10-24 DIAGNOSIS — O91119 Abscess of breast associated with pregnancy, unspecified trimester: Secondary | ICD-10-CM

## 2016-10-24 DIAGNOSIS — Z79899 Other long term (current) drug therapy: Secondary | ICD-10-CM | POA: Insufficient documentation

## 2016-10-24 DIAGNOSIS — R109 Unspecified abdominal pain: Secondary | ICD-10-CM | POA: Insufficient documentation

## 2016-10-24 DIAGNOSIS — Z888 Allergy status to other drugs, medicaments and biological substances status: Secondary | ICD-10-CM | POA: Insufficient documentation

## 2016-10-24 DIAGNOSIS — O9A213 Injury, poisoning and certain other consequences of external causes complicating pregnancy, third trimester: Secondary | ICD-10-CM | POA: Diagnosis not present

## 2016-10-24 DIAGNOSIS — Z808 Family history of malignant neoplasm of other organs or systems: Secondary | ICD-10-CM | POA: Insufficient documentation

## 2016-10-24 DIAGNOSIS — T50905A Adverse effect of unspecified drugs, medicaments and biological substances, initial encounter: Secondary | ICD-10-CM

## 2016-10-24 DIAGNOSIS — R197 Diarrhea, unspecified: Secondary | ICD-10-CM | POA: Insufficient documentation

## 2016-10-24 DIAGNOSIS — T3695XA Adverse effect of unspecified systemic antibiotic, initial encounter: Secondary | ICD-10-CM

## 2016-10-24 DIAGNOSIS — O26893 Other specified pregnancy related conditions, third trimester: Secondary | ICD-10-CM | POA: Diagnosis not present

## 2016-10-24 DIAGNOSIS — O91113 Abscess of breast associated with pregnancy, third trimester: Secondary | ICD-10-CM | POA: Insufficient documentation

## 2016-10-24 DIAGNOSIS — Z9889 Other specified postprocedural states: Secondary | ICD-10-CM | POA: Diagnosis not present

## 2016-10-24 DIAGNOSIS — O9989 Other specified diseases and conditions complicating pregnancy, childbirth and the puerperium: Secondary | ICD-10-CM | POA: Diagnosis not present

## 2016-10-24 DIAGNOSIS — Z811 Family history of alcohol abuse and dependence: Secondary | ICD-10-CM | POA: Diagnosis not present

## 2016-10-24 DIAGNOSIS — O4703 False labor before 37 completed weeks of gestation, third trimester: Secondary | ICD-10-CM

## 2016-10-24 DIAGNOSIS — Z3A35 35 weeks gestation of pregnancy: Secondary | ICD-10-CM | POA: Diagnosis not present

## 2016-10-24 LAB — CBC
HCT: 33.1 % — ABNORMAL LOW (ref 36.0–46.0)
Hemoglobin: 10.5 g/dL — ABNORMAL LOW (ref 12.0–15.0)
MCH: 25.4 pg — ABNORMAL LOW (ref 26.0–34.0)
MCHC: 31.7 g/dL (ref 30.0–36.0)
MCV: 80 fL (ref 78.0–100.0)
Platelets: 364 10*3/uL (ref 150–400)
RBC: 4.14 MIL/uL (ref 3.87–5.11)
RDW: 15.7 % — ABNORMAL HIGH (ref 11.5–15.5)
WBC: 6 10*3/uL (ref 4.0–10.5)

## 2016-10-24 LAB — COMPREHENSIVE METABOLIC PANEL
ALT: 32 U/L (ref 14–54)
AST: 59 U/L — AB (ref 15–41)
Albumin: 2.8 g/dL — ABNORMAL LOW (ref 3.5–5.0)
Alkaline Phosphatase: 190 U/L — ABNORMAL HIGH (ref 38–126)
Anion gap: 9 (ref 5–15)
BUN: 6 mg/dL (ref 6–20)
CO2: 24 mmol/L (ref 22–32)
CREATININE: 0.56 mg/dL (ref 0.44–1.00)
Calcium: 9.3 mg/dL (ref 8.9–10.3)
Chloride: 103 mmol/L (ref 101–111)
GFR calc Af Amer: >60 mL/min (ref >60)
GFR calc non Af Amer: >60 mL/min (ref >60)
Glucose, Bld: 79 mg/dL (ref 65–99)
Potassium: 3.8 mmol/L (ref 3.5–5.1)
SODIUM: 136 mmol/L (ref 135–145)
Total Bilirubin: 0.6 mg/dL (ref 0.3–1.2)
Total Protein: 6.8 g/dL (ref 6.5–8.1)

## 2016-10-24 LAB — URINALYSIS, ROUTINE W REFLEX MICROSCOPIC
BILIRUBIN URINE: NEGATIVE
Glucose, UA: NEGATIVE mg/dL
Hgb urine dipstick: NEGATIVE
KETONES UR: NEGATIVE mg/dL
Nitrite: NEGATIVE
Protein, ur: 30 mg/dL — AB
Specific Gravity, Urine: 1.018 (ref 1.005–1.030)
pH: 7 (ref 5.0–8.0)

## 2016-10-24 MED ORDER — AMPICILLIN 500 MG PO CAPS: 500.0000 mg | ORAL_CAPSULE | Freq: Four times a day (QID) | ORAL | 0 refills | Status: DC

## 2016-10-24 MED ORDER — LACTATED RINGERS IV BOLUS (SEPSIS)
1000.0000 mL | Freq: Once | INTRAVENOUS | Status: AC
  Administered 2016-10-24: 1000 mL via INTRAVENOUS

## 2016-10-24 NOTE — MAU Provider Note (Signed)
History     CSN: [id number]  Arrival date and time: 10/24/16 1330   First Provider Initiated Contact with Patient 10/24/16 1505      Chief Complaint  Patient presents with  . Diarrhea   HPI Jean Cabrera is a 36 y.o. [phone] at [redacted]w[redacted]d who presents with abdominal pain, vomiting, and diarrhea. Reports diarrhea since starting keflex for breast abscess Was started on keflex after being seen in MAU on 1/13. Reports loose stools associated with taking the abx. Last night had episode of severe abdominal pain followed by vomiting & diarrhea within minutes of taking keflex.  Has had 1 episode of loose stool today & 2 episodes of vomiting. Denies nausea unless she drinks something. Has not taken anything for symptoms. Also reports continued abdominal pain that is now intermittent and associated with abdominal cramping. Rates pain 6/10. Has not treated. Nothing makes better or worse.  Denies vaginal bleeding, LOF, fever/chills. Positive fetal movement.  States had large amount of drainage from breast abscess the other day; pain improved.   OB History    Gravida Para Term Preterm AB Living   5 3 3   1 3    SAB TAB Ectopic Multiple Live Births   0 1   1 3       Past Medical History:  Diagnosis Date  . Abnormal Pap smear   . Anemia   . Late prenatal care    13 weeks  . Myringotomy tube status   . Uterine fibroids affecting pregnancy     Past Surgical History:  Procedure Laterality Date  . CESAREAN SECTION N/A 04/09/2014   Procedure: CESAREAN SECTION;  Surgeon: Farrel Gobble. Harrington Challenger, MD;  Location: [facility];  Service: Obstetrics;  Laterality: N/A;  . COLPOSCOPY    . DILATION AND CURETTAGE OF UTERUS    . tubes in ear      Family History  Problem Relation Age of Onset  . Thyroid disease Paternal Aunt   . COPD Paternal Aunt     thyroid cancer  . Alcohol abuse Paternal Aunt   . Drug abuse Paternal Aunt   . Mental illness Cousin     bipolar  . Alcohol abuse Father   . Drug abuse Father      Social History  Substance Use Topics  . Smoking status: Never Smoker  . Smokeless tobacco: Never Used  . Alcohol use No    Allergies:  Allergies  Allergen Reactions  . 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    . Latex Itching    Prescriptions Prior to Admission  Medication Sig Dispense Refill Last Dose  . cephALEXin (KEFLEX) 500 MG capsule Take 1 capsule (500 mg total) by mouth 4 (four) times daily. 56 capsule 0 10/23/2016 at Unknown time  . Prenatal Vit-Fe Phos-FA-Omega (VITAFOL GUMMIES) 3.33-0.333-34.8 MG CHEW Chew 3 each by mouth at bedtime.   10/24/2016 at Unknown time    Review of Systems  Constitutional: Negative for chills and fever.  Gastrointestinal: Positive for abdominal pain, diarrhea, nausea and vomiting. Negative for constipation.  Genitourinary: Negative for vaginal bleeding and vaginal discharge.  Skin: Positive for wound.   Physical Exam   Blood pressure 121/77, pulse 66, temperature 99.2 F (37.3 C), resp. rate 18, height 5\' 3"  (1.6 m), weight 207 lb 6.4 oz (94.1 kg), last menstrual period 02/19/2016, unknown if currently breastfeeding.  Physical Exam  Nursing note and vitals reviewed. Constitutional: She is oriented to person, place,  and time. She appears well-developed and well-nourished. No distress.  HENT:  Head: Normocephalic and atraumatic.  Eyes: Conjunctivae are normal. Right eye exhibits no discharge. Left eye exhibits no discharge. No scleral icterus.  Neck: Normal range of motion.  Cardiovascular: Normal rate, regular rhythm and normal heart sounds.   No murmur heard. Respiratory: Effort normal and breath sounds normal. No respiratory distress. She has no wheezes.  GI: Soft. Bowel sounds are normal. There is no tenderness.  Neurological: She is alert and oriented to person, place, and time.  Skin: Skin is warm and dry. She is not diaphoretic.  Psychiatric: She has a normal mood and affect. Her behavior is  normal. Judgment and thought content normal.   Dilation: 1 Effacement (%): Thick Exam by:: Blair Hailey RN   Fetal Tracing:  Baseline: 130 Variability: moderate Accelerations: 15x15 Decelerations: none  Toco: 3-5 min, 60-120 sec MAU Course  Procedures Results for orders placed or performed during the hospital encounter of 10/24/16 (from the past 24 hour(s))  Urinalysis, Routine w reflex microscopic     Status: Abnormal   Collection Time: 10/24/16  2:31 PM  Result Value Ref Range   Color, Urine YELLOW YELLOW   APPearance HAZY (A) CLEAR   Specific Gravity, Urine 1.018 1.005 - 1.030   pH 7.0 5.0 - 8.0   Glucose, UA NEGATIVE NEGATIVE mg/dL   Hgb urine dipstick NEGATIVE NEGATIVE   Bilirubin Urine NEGATIVE NEGATIVE   Ketones, ur NEGATIVE NEGATIVE mg/dL   Protein, ur 30 (A) NEGATIVE mg/dL   Nitrite NEGATIVE NEGATIVE   Leukocytes, UA LARGE (A) NEGATIVE   RBC / HPF 0-5 0 - 5 RBC/hpf   WBC, UA 0-5 0 - 5 WBC/hpf   Bacteria, UA FEW (A) NONE SEEN   Squamous Epithelial / LPF 6-30 (A) NONE SEEN   Mucous PRESENT   CBC     Status: Abnormal   Collection Time: 10/24/16  3:45 PM  Result Value Ref Range   WBC 6.0 4.0 - 10.5 K/uL   RBC 4.14 3.87 - 5.11 MIL/uL   Hemoglobin 10.5 (L) 12.0 - 15.0 g/dL   HCT 33.1 (L) 36.0 - 46.0 %   MCV 80.0 78.0 - 100.0 fL   MCH 25.4 (L) 26.0 - 34.0 pg   MCHC 31.7 30.0 - 36.0 g/dL   RDW 15.7 (H) 11.5 - 15.5 %   Platelets 364 150 - 400 K/uL  Comprehensive metabolic panel     Status: Abnormal   Collection Time: 10/24/16  3:45 PM  Result Value Ref Range   Sodium 136 135 - 145 mmol/L   Potassium 3.8 3.5 - 5.1 mmol/L   Chloride 103 101 - 111 mmol/L   CO2 24 22 - 32 mmol/L   Glucose, Bld 79 65 - 99 mg/dL   BUN 6 6 - 20 mg/dL   Creatinine, Ser 0.56 0.44 - 1.00 mg/dL   Calcium 9.3 8.9 - 10.3 mg/dL   Total Protein 6.8 6.5 - 8.1 g/dL   Albumin 2.8 (L) 3.5 - 5.0 g/dL   AST 59 (H) 15 - 41 U/L   ALT 32 14 - 54 U/L   Alkaline Phosphatase 190 (H) 38 - 126 U/L    Total Bilirubin 0.6 0.3 - 1.2 mg/dL   GFR calc non Af Amer >60 >60 mL/min   GFR calc Af Amer >60 >60 mL/min   Anion gap 9 5 - 15    MDM IV LR bolus, CBC, CMP Pt declines antiemetic SVE same as office  visit 2 days ago & unchanged after an hour in MAU Pt reports improvement in symptoms & requesting to be discharged Per wound culture obtained last MAU visit; bacteria is sensitive to ampicillin, gentamicin, & vancomycin. Will switch pt to ampicillin & have her follow up with the breast center.   Assessment and Plan  A: 1. Breast abscess during pregnancy, antepartum   2. Preterm uterine contractions in third trimester, antepartum   3. Adverse effect of drug, initial encounter    P: Discharge home D/c keflex Rx ampicillin Call breast center tomorrow for follow up appointment Keep f/u with OB Discussed reasons to return to MAU Advance diet as tolerated  Jorje Guild 10/24/2016, 3:05 PM

## 2016-10-24 NOTE — Discharge Instructions (Signed)
Skin Abscess A skin abscess is an infected area on or under your skin that contains a collection of pus and other material. An abscess may also be called a furuncle, carbuncle, or boil. An abscess can occur in or on almost any part of your body. Some abscesses break open (rupture) on their own. Most continue to get worse unless they are treated. The infection can spread deeper into the body and eventually into your blood, which can make you feel ill. Treatment usually involves draining the abscess. What are the causes? An abscess occurs when germs, often bacteria, pass through your skin and cause an infection. This may be caused by:  A scrape or cut on your skin.  A puncture wound through your skin, including a needle injection.  Blocked oil or sweat glands.  Blocked and infected hair follicles.  A cyst that forms beneath your skin (sebaceous cyst) and becomes infected. What increases the risk? This condition is more likely to develop in people who:  Have a weak body defense system (immune system).  Have diabetes.  Have dry and irritated skin.  Get frequent injections or use illegal IV drugs.  Have a foreign body in a wound, such as a splinter.  Have problems with their lymph system or veins. What are the signs or symptoms? An abscess may start as a painful, firm bump under the skin. Over time, the abscess may get larger or become softer. Pus may appear at the top of the abscess, causing pressure and pain. It may eventually break through the skin and drain. Other symptoms include:  Redness.  Warmth.  Swelling.  Tenderness.  A sore on the skin. How is this diagnosed? This condition is diagnosed based on your medical history and a physical exam. A sample of pus may be taken from the abscess to find out what is causing the infection and what antibiotics can be used to treat it. You also may have:  Blood tests to look for signs of infection or spread of an infection to your  blood.  Imaging studies such as ultrasound, CT scan, or MRI if the abscess is deep. How is this treated? Small abscesses that drain on their own may not need treatment. Treatment for an abscess that does not rupture on its own may include:  Warm compresses applied to the area several times per day.  Incision and drainage. Your health care provider will make an incision to open the abscess and will remove pus and any foreign body or dead tissue. The incision area may be packed with gauze to keep it open for a few days while it heals.  Antibiotic medicines to treat infection. For a severe abscess, you may first get antibiotics through an IV and then change to oral antibiotics. Follow these instructions at home: Abscess Care  If you have an abscess that has not drained, place a warm, clean, wet washcloth over the abscess several times a day. Do this as told by your health care provider.  Follow instructions from your health care provider about how to take care of your abscess. Make sure you:  Cover the abscess with a bandage (dressing).  Change your dressing or gauze as told by your health care provider.  Wash your hands with soap and water before you change the dressing or gauze. If soap and water are not available, use hand sanitizer.  Check your abscess every day for signs of a worsening infection. Check for:  More redness, swelling, or pain.  More fluid or blood.  Warmth.  More pus or a bad smell. Medicines  Take over-the-counter and prescription medicines only as told by your health care provider.  If you were prescribed an antibiotic medicine, take it as told by your health care provider. Do not stop taking the antibiotic even if you start to feel better. General instructions  To avoid spreading the infection:  Do not share personal care items, towels, or hot tubs with others.  Avoid making skin contact with other people.  Keep all follow-up visits as told by your  health care provider. This is important. Contact a health care provider if:  You have more redness, swelling, or pain around your abscess.  You have more fluid or blood coming from your abscess.  Your abscess feels warm to the touch.  You have more pus or a bad smell coming from your abscess.  You have a fever.  You have muscle aches.  You have chills or a general ill feeling. Get help right away if:  You have severe pain.  You see red streaks on your skin spreading away from the abscess. This information is not intended to replace advice given to you by your health care provider. Make sure you discuss any questions you have with your health care provider. Document Released: 07/03/2005 Document Revised: 05/19/2016 Document Reviewed: 08/02/2015 Elsevier Interactive Patient Education  2017 [employer] of the uterus can occur throughout pregnancy. Contractions are not always a sign that you are in labor.  WHAT ARE BRAXTON HICKS CONTRACTIONS?  Contractions that occur before labor are called Braxton Hicks contractions, or false labor. Toward the end of pregnancy (32-34 weeks), these contractions can develop more often and may become more forceful. This is not true labor because these contractions do not result in opening (dilatation) and thinning of the cervix. They are sometimes difficult to tell apart from true labor because these contractions can be forceful and people have different pain tolerances. You should not feel embarrassed if you go to the hospital with false labor. Sometimes, the only way to tell if you are in true labor is for your health care provider to look for changes in the cervix. If there are no prenatal problems or other health problems associated with the pregnancy, it is completely safe to be sent home with false labor and await the onset of true labor. HOW CAN YOU TELL THE DIFFERENCE BETWEEN TRUE AND FALSE LABOR? False Labor    The contractions of false labor are usually shorter and not as hard as those of true labor.   The contractions are usually irregular.   The contractions are often felt in the front of the lower abdomen and in the groin.   The contractions may go away when you walk around or change positions while lying down.   The contractions get weaker and are shorter lasting as time goes on.   The contractions do not usually become progressively stronger, regular, and closer together as with true labor.  True Labor   Contractions in true labor last 30-70 seconds, become very regular, usually become more intense, and increase in frequency.   The contractions do not go away with walking.   The discomfort is usually felt in the top of the uterus and spreads to the lower abdomen and low back.   True labor can be determined by your health care provider with an exam. This will show that the cervix is dilating and getting thinner.  WHAT TO REMEMBER  Keep up with your usual exercises and follow other instructions given by your health care provider.   Take medicines as directed by your health care provider.   Keep your regular prenatal appointments.   Eat and drink lightly if you think you are going into labor.   If Braxton Hicks contractions are making you uncomfortable:   Change your position from lying down or resting to walking, or from walking to resting.   Sit and rest in a tub of warm water.   Drink 2-3 glasses of water. Dehydration may cause these contractions.   Do slow and deep breathing several times an hour.  WHEN SHOULD I SEEK IMMEDIATE MEDICAL CARE? Seek immediate medical care if:  Your contractions become stronger, more regular, and closer together.   You have fluid leaking or gushing from your vagina.   You have a fever.   You pass blood-tinged mucus.   You have vaginal bleeding.   You have continuous abdominal pain.   You have low back pain  that you never had before.   You feel your baby's head pushing down and causing pelvic pressure.   Your baby is not moving as much as it used to.  This information is not intended to replace advice given to you by your health care provider. Make sure you discuss any questions you have with your health care provider. Document Released: 09/23/2005 Document Revised: 01/15/2016 Document Reviewed: 07/05/2013 Elsevier Interactive Patient Education  2017 [employer].

## 2016-10-24 NOTE — MAU Note (Signed)
Pt took Keflex for a breast abscess.   Affter she Last night and about 10 min after had really bad abd cramping and nausea/vomiting/diahrreah. Feels like she is having ctx as well.

## 2016-10-25 ENCOUNTER — Other Ambulatory Visit: Payer: Self-pay | Admitting: Student

## 2016-10-25 ENCOUNTER — Ambulatory Visit
Admission: RE | Admit: 2016-10-25 | Discharge: 2016-10-25 | Disposition: A | Discharge status: Home / Self Care | Payer: 59 | Source: Ambulatory Visit | Attending: Student | Admitting: Student

## 2016-10-25 DIAGNOSIS — O91119 Abscess of breast associated with pregnancy, unspecified trimester: Secondary | ICD-10-CM

## 2016-10-25 DIAGNOSIS — N6312 Unspecified lump in the right breast, upper inner quadrant: Secondary | ICD-10-CM | POA: Diagnosis not present

## 2016-10-25 DIAGNOSIS — N611 Abscess of the breast and nipple: Secondary | ICD-10-CM | POA: Diagnosis not present

## 2016-11-06 ENCOUNTER — Encounter (HOSPITAL_COMMUNITY): Payer: Self-pay

## 2016-11-06 ENCOUNTER — Inpatient Hospital Stay (HOSPITAL_COMMUNITY)
Admission: AD | Admit: 2016-11-06 | Discharge: 2016-11-07 | DRG: 780 | Disposition: A | Discharge status: Home / Self Care | Payer: 59 | Source: Ambulatory Visit | Attending: Obstetrics and Gynecology | Admitting: Obstetrics and Gynecology

## 2016-11-06 DIAGNOSIS — Z3A37 37 weeks gestation of pregnancy: Secondary | ICD-10-CM

## 2016-11-06 DIAGNOSIS — O34219 Maternal care for unspecified type scar from previous cesarean delivery: Secondary | ICD-10-CM | POA: Diagnosis present

## 2016-11-06 DIAGNOSIS — O4702 False labor before 37 completed weeks of gestation, second trimester: Secondary | ICD-10-CM

## 2016-11-06 DIAGNOSIS — Z3490 Encounter for supervision of normal pregnancy, unspecified, unspecified trimester: Secondary | ICD-10-CM

## 2016-11-06 DIAGNOSIS — O471 False labor at or after 37 completed weeks of gestation: Secondary | ICD-10-CM | POA: Diagnosis present

## 2016-11-06 DIAGNOSIS — Z3483 Encounter for supervision of other normal pregnancy, third trimester: Secondary | ICD-10-CM | POA: Diagnosis not present

## 2016-11-06 DIAGNOSIS — O479 False labor, unspecified: Secondary | ICD-10-CM

## 2016-11-06 NOTE — H&P (Signed)
LABOR AND DELIVERY ADMISSION HISTORY AND PHYSICAL NOTE  Jean Cabrera is a 36 y.o. female [phone] with IUP at [redacted]w[redacted]d by LMP presenting for contractions of increasing frequency and intensity. Contractions first started several days ago, but have increased throughout the day today.   She reports positive fetal movement. She denies leakage of fluid or vaginal bleeding.  Prenatal History/Complications: Previous c-section secondary to fetal intolerance Galactocele requiring ampicillin treatment  Past Medical History: Past Medical History:  Diagnosis Date  . Abnormal Pap smear   . Anemia   . Late prenatal care    13 weeks  . Myringotomy tube status   . Uterine fibroids affecting pregnancy     Past Surgical History: Past Surgical History:  Procedure Laterality Date  . CESAREAN SECTION N/A 04/09/2014   Procedure: CESAREAN SECTION;  Surgeon: Farrel Gobble. Harrington Challenger, MD;  Location: [facility];  Service: Obstetrics;  Laterality: N/A;  . COLPOSCOPY    . DILATION AND CURETTAGE OF UTERUS    . tubes in ear      Obstetrical History: OB History    Gravida Para Term Preterm AB Living   5 3 3   1 3    SAB TAB Ectopic Multiple Live Births   0 1   1 3       Social History: Social History   Social History  . Marital status: Married    Spouse name: N/A  . Number of children: N/A  . Years of education: N/A   Social History Main Topics  . Smoking status: Never Smoker  . Smokeless tobacco: Never Used  . Alcohol use No  . Drug use: No  . Sexual activity: Yes   Other Topics Concern  . None   Social History Narrative  . None    Family History: Family History  Problem Relation Age of Onset  . Thyroid disease Paternal Aunt   . COPD Paternal Aunt     thyroid cancer  . Alcohol abuse Paternal Aunt   . Drug abuse Paternal Aunt   . Mental illness Cousin     bipolar  . Alcohol abuse Father   . Drug abuse Father     Allergies: Allergies  Allergen Reactions  . 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    . Latex Itching    Prescriptions Prior to Admission  Medication Sig Dispense Refill Last Dose  . ampicillin (PRINCIPEN) 500 MG capsule Take 1 capsule (500 mg total) by mouth 4 (four) times daily. 40 capsule 0 11/06/2016 at Unknown time  . Prenatal Vit-Fe Fumarate-FA (PRENATAL MULTIVITAMIN) TABS tablet Take 1 tablet by mouth daily at 12 noon.   11/06/2016 at Unknown time     Review of Systems   All systems reviewed and negative except as stated in HPI  Blood pressure 123/79, pulse 78, temperature 98.2 F (36.8 C), temperature source Oral, resp. rate 18, height 5' 3.5" (1.613 m), weight 209 lb (94.8 kg), last menstrual period 02/19/2016, SpO2 99 %, unknown if currently breastfeeding. General appearance: alert, cooperative and no distress Lungs: clear to auscultation bilaterally Heart: regular rate and rhythm Abdomen: soft, non-tender; bowel sounds normal Extremities: No calf swelling or tenderness Presentation: cephalic Fetal monitoring: FHT with baseline in the 140s, + accels, occasional variable decels, min-mod variability. Appears decreased recently.  Uterine activity: contractions q3-4 min Dilation: 3.5 Effacement (%): 60 Station: -3 Exam by:: Wilhemena Durie RN   Prenatal labs: ABO, Rh:  O positive Antibody:  Negative Rubella: Immune RPR:   non-reactive HBsAg:   negative HIV:   negative GBS:   negative 1 hr Glucola: 95 Genetic screening: too late to prenatal car Anatomy US: normal  Prenatal Transfer Tool  Maternal Diabetes: No Genetic Screening: Declined Maternal Ultrasounds/Referrals: Normal Fetal Ultrasounds or other Referrals:  None Maternal Substance Abuse:  No Significant Maternal Medications:  None Significant Maternal Lab Results: Lab values include: Group B Strep negative  No results found for this or any previous visit (from the past 24 hour(s)).  Patient Active Problem List   Diagnosis Date Noted  . Observation  and evaluation of newborn for suspected infectious condition 04/11/2014  . Fever in newborn 04/11/2014  . Potential for hyperbilirubinemia in newborn 04/11/2014  . Cesarean delivery delivered 04/09/2014  . Post term pregnancy, antepartum condition or complication [id number]    Assessment: Jean Cabrera is a 36 y.o. [phone] at [redacted]w[redacted]d here for contractions and found to have active cervical change while in the MAU as well as occasional variable decelerations on monitoring.   #Labor:TOLAC. Admitted with plans for expectant management given gestational age. Patient also with difficulty handling last delivery, where she had augmentation of labor and ended up requiring a c-section. She would like minimal interventions with regard to augmentation of labor, if possible and as long as fetal well-being is not jeopardized.  #Pain: Will consider epidural when appropriate. Will ensure she continues to make cervical change before ordering.  #FWB: Cat I-II given decreased variability and occasional variables #ID:  GBS negative- no abx indicated. May need to resume ampicillin for galactocele post partum #MOF: Bottle #MOC: Vasectomy #Circ:  N/A  Loann Quill, MD PGY-2 11/06/2016, 11:29 PM   CNM attestation:  I have participated in the care of this patient; I agree with above documentation in the resident's note.   Jean Cabrera is a 36 y.o. [phone] here for latent labor  PE: BP 126/76 (BP Location: Right Arm)   Pulse 76   Temp 98.3 F (36.8 C) (Oral)   Resp 20   Ht 5\' 3"  (1.6 m)   Wt 94.8 kg (209 lb)   LMP 02/19/2016   SpO2 99%   BMI 37.02 kg/m  Gen: calm comfortable, NAD Resp: normal effort, no distress Abd: gravid  ROS, labs, PMH reviewed  Plan: Admit to [facility] due to cx change Plan for [facility] Expectant management  Serita Grammes CNM 11/07/2016, 9:19 AM

## 2016-11-06 NOTE — MAU Note (Signed)
Pt reports she has been having contractions for several days and about 1 hour ago she started having sharp bilateral lower abd pain.

## 2016-11-07 DIAGNOSIS — O4702 False labor before 37 completed weeks of gestation, second trimester: Secondary | ICD-10-CM

## 2016-11-07 DIAGNOSIS — Z3A37 37 weeks gestation of pregnancy: Secondary | ICD-10-CM

## 2016-11-07 DIAGNOSIS — Z3490 Encounter for supervision of normal pregnancy, unspecified, unspecified trimester: Secondary | ICD-10-CM

## 2016-11-07 DIAGNOSIS — O479 False labor, unspecified: Secondary | ICD-10-CM

## 2016-11-07 LAB — CBC
HEMATOCRIT: 34.3 % — AB (ref 36.0–46.0)
HEMOGLOBIN: 11.1 g/dL — AB (ref 12.0–15.0)
MCH: 25.7 pg — ABNORMAL LOW (ref 26.0–34.0)
MCHC: 32.4 g/dL (ref 30.0–36.0)
MCV: 79.4 fL (ref 78.0–100.0)
Platelets: 352 10*3/uL (ref 150–400)
RBC: 4.32 MIL/uL (ref 3.87–5.11)
RDW: 15.9 % — AB (ref 11.5–15.5)
WBC: 6.7 10*3/uL (ref 4.0–10.5)

## 2016-11-07 LAB — RPR: RPR Ser Ql: NONREACTIVE

## 2016-11-07 LAB — TYPE AND SCREEN
ABO/RH(D): O POS
ANTIBODY SCREEN: NEGATIVE

## 2016-11-07 MED ORDER — OXYTOCIN 40 UNITS IN LACTATED RINGERS INFUSION - SIMPLE MED: 2.5000 [IU]/h | INTRAVENOUS | Status: DC

## 2016-11-07 MED ORDER — SOD CITRATE-CITRIC ACID 500-334 MG/5ML PO SOLN: 30.0000 mL | ORAL | Status: DC | PRN

## 2016-11-07 MED ORDER — OXYCODONE-ACETAMINOPHEN 5-325 MG PO TABS: 1.0000 | ORAL_TABLET | ORAL | Status: DC | PRN

## 2016-11-07 MED ORDER — ONDANSETRON HCL 4 MG/2ML IJ SOLN: 4.0000 mg | Freq: Four times a day (QID) | INTRAMUSCULAR | Status: DC | PRN

## 2016-11-07 MED ORDER — LACTATED RINGERS IV SOLN: 500.0000 mL | INTRAVENOUS | Status: DC | PRN

## 2016-11-07 MED ORDER — ACETAMINOPHEN 325 MG PO TABS: 650.0000 mg | ORAL_TABLET | ORAL | Status: DC | PRN

## 2016-11-07 MED ORDER — FLEET ENEMA 7-19 GM/118ML RE ENEM: 1.0000 | ENEMA | RECTAL | Status: DC | PRN

## 2016-11-07 MED ORDER — LIDOCAINE HCL (PF) 1 % IJ SOLN: 30.0000 mL | INTRAMUSCULAR | Status: DC | PRN

## 2016-11-07 MED ORDER — LACTATED RINGERS IV SOLN
INTRAVENOUS | Status: DC
  Administered 2016-11-07: via INTRAVENOUS

## 2016-11-07 MED ORDER — FENTANYL CITRATE (PF) 100 MCG/2ML IJ SOLN: 100.0000 ug | INTRAMUSCULAR | Status: DC | PRN

## 2016-11-07 MED ORDER — OXYTOCIN BOLUS FROM INFUSION: 500.0000 mL | Freq: Once | INTRAVENOUS | Status: DC

## 2016-11-07 NOTE — Discharge Instructions (Signed)
Third Trimester of Pregnancy The third trimester is from week 29 through week 40 (months 7 through 9). The third trimester is a time when the unborn baby (fetus) is growing rapidly. At the end of the ninth month, the fetus is about 20 inches in length and weighs 6-10 pounds. Body changes during your third trimester Your body goes through many changes during pregnancy. The changes vary from woman to woman. During the third trimester:  Your weight will continue to increase. You can expect to gain 25-35 pounds (11-16 kg) by the end of the pregnancy.  You may begin to get stretch marks on your hips, abdomen, and breasts.  You may urinate more often because the fetus is moving lower into your pelvis and pressing on your bladder.  You may develop or continue to have heartburn. This is caused by increased hormones that slow down muscles in the digestive tract.  You may develop or continue to have constipation because increased hormones slow digestion and cause the muscles that push waste through your intestines to relax.  You may develop hemorrhoids. These are swollen veins (varicose veins) in the rectum that can itch or be painful.  You may develop swollen, bulging veins (varicose veins) in your legs.  You may have increased body aches in the pelvis, back, or thighs. This is due to weight gain and increased hormones that are relaxing your joints.  You may have changes in your hair. These can include thickening of your hair, rapid growth, and changes in texture. Some women also have hair loss during or after pregnancy, or hair that feels dry or thin. Your hair will most likely return to normal after your baby is born.  Your breasts will continue to grow and they will continue to become tender. A yellow fluid (colostrum) may leak from your breasts. This is the first milk you are producing for your baby.  Your belly button may stick out.  You may notice more swelling in your hands, face, or  ankles.  You may have increased tingling or numbness in your hands, arms, and legs. The skin on your belly may also feel numb.  You may feel short of breath because of your expanding uterus.  You may have more problems sleeping. This can be caused by the size of your belly, increased need to urinate, and an increase in your body's metabolism.  You may notice the fetus "dropping," or moving lower in your abdomen.  You may have increased vaginal discharge.  Your cervix becomes thin and soft (effaced) near your due date. What to expect at prenatal visits You will have prenatal exams every 2 weeks until week 36. Then you will have weekly prenatal exams. During a routine prenatal visit:  You will be weighed to make sure you and the fetus are growing normally.  Your blood pressure will be taken.  Your abdomen will be measured to track your baby's growth.  The fetal heartbeat will be listened to.  Any test results from the previous visit will be discussed.  You may have a cervical check near your due date to see if you have effaced. At around 36 weeks, your health care provider will check your cervix. At the same time, your health care provider will also perform a test on the secretions of the vaginal tissue. This test is to determine if a type of bacteria, Group B streptococcus, is present. Your health care provider will explain this further. Your health care provider may ask you:    What your birth plan is.  How you are feeling.  If you are feeling the baby move.  If you have had any abnormal symptoms, such as leaking fluid, bleeding, severe headaches, or abdominal cramping.  If you are using any tobacco products, including cigarettes, chewing tobacco, and electronic cigarettes.  If you have any questions. Other tests or screenings that may be performed during your third trimester include:  Blood tests that check for low iron levels (anemia).  Fetal testing to check the health,  activity level, and growth of the fetus. Testing is done if you have certain medical conditions or if there are problems during the pregnancy.  Nonstress test (NST). This test checks the health of your baby to make sure there are no signs of problems, such as the baby not getting enough oxygen. During this test, a belt is placed around your belly. The baby is made to move, and its heart rate is monitored during movement. What is false labor? False labor is a condition in which you feel small, irregular tightenings of the muscles in the womb (contractions) that eventually go away. These are called Braxton Hicks contractions. Contractions may last for hours, days, or even weeks before true labor sets in. If contractions come at regular intervals, become more frequent, increase in intensity, or become painful, you should see your health care provider. What are the signs of labor?  Abdominal cramps.  Regular contractions that start at 10 minutes apart and become stronger and more frequent with time.  Contractions that start on the top of the uterus and spread down to the lower abdomen and back.  Increased pelvic pressure and dull back pain.  A watery or bloody mucus discharge that comes from the vagina.  Leaking of amniotic fluid. This is also known as your "water breaking." It could be a slow trickle or a gush. Let your doctor know if it has a color or strange odor. If you have any of these signs, call your health care provider right away, even if it is before your due date. Follow these instructions at home: Eating and drinking  Continue to eat regular, healthy meals.  Do not eat:  Raw meat or meat spreads.  Unpasteurized milk or cheese.  Unpasteurized juice.  Store-made salad.  Refrigerated smoked seafood.  Hot dogs or deli meat, unless they are piping hot.  More than 6 ounces of albacore tuna a week.  Shark, swordfish, king mackerel, or tile fish.  Store-made salads.  Raw  sprouts, such as mung bean or alfalfa sprouts.  Take prenatal vitamins as told by your health care provider.  Take 1000 mg of calcium daily as told by your health care provider.  If you develop constipation:  Take over-the-counter or prescription medicines.  Drink enough fluid to keep your urine clear or pale yellow.  Eat foods that are high in fiber, such as fresh fruits and vegetables, whole grains, and beans.  Limit foods that are high in fat and processed sugars, such as fried and sweet foods. Activity  Exercise only as directed by your health care provider. Healthy pregnant women should aim for 2 hours and 30 minutes of moderate exercise per week. If you experience any pain or discomfort while exercising, stop.  Avoid heavy lifting.  Do not exercise in extreme heat or humidity, or at high altitudes.  Wear low-heel, comfortable shoes.  Practice good posture.  Do not travel far distances unless it is absolutely necessary and only with the approval   of your health care provider.  Wear your seat belt at all times while in a car, on a bus, or on a plane.  Take frequent breaks and rest with your legs elevated if you have leg cramps or low back pain.  Do not use hot tubs, steam rooms, or saunas.  You may continue to have sex unless your health care provider tells you otherwise. Lifestyle  Do not use any products that contain nicotine or tobacco, such as cigarettes and e-cigarettes. If you need help quitting, ask your health care provider.  Do not drink alcohol.  Do not use any medicinal herbs or unprescribed drugs. These chemicals affect the formation and growth of the baby.  If you develop varicose veins:  Wear support pantyhose or compression stockings as told by your healthcare provider.  Elevate your feet for 15 minutes, 3-4 times a day.  Wear a supportive maternity bra to help with breast tenderness. General instructions  Take over-the-counter and prescription  medicines only as told by your health care provider. There are medicines that are either safe or unsafe to take during pregnancy.  Take warm sitz baths to soothe any pain or discomfort caused by hemorrhoids. Use hemorrhoid cream or witch hazel if your health care provider approves.  Avoid cat litter boxes and soil used by cats. These carry germs that can cause birth defects in the baby. If you have a cat, ask someone to clean the litter box for you.  To prepare for the arrival of your baby:  Take prenatal classes to understand, practice, and ask questions about the labor and delivery.  Make a trial run to the hospital.  Visit the hospital and tour the maternity area.  Arrange for maternity or paternity leave through employers.  Arrange for family and friends to take care of pets while you are in the hospital.  Purchase a rear-facing car seat and make sure you know how to install it in your car.  Pack your hospital bag.  Prepare the baby's nursery. Make sure to remove all pillows and stuffed animals from the baby's crib to prevent suffocation.  Visit your dentist if you have not gone during your pregnancy. Use a soft toothbrush to brush your teeth and be gentle when you floss.  Keep all prenatal follow-up visits as told by your health care provider. This is important. Contact a health care provider if:  You are unsure if you are in labor or if your water has broken.  You become dizzy.  You have mild pelvic cramps, pelvic pressure, or nagging pain in your abdominal area.  You have lower back pain.  You have persistent nausea, vomiting, or diarrhea.  You have an unusual or bad smelling vaginal discharge.  You have pain when you urinate. Get help right away if:  You have a fever.  You are leaking fluid from your vagina.  You have spotting or bleeding from your vagina.  You have severe abdominal pain or cramping.  You have rapid weight loss or weight gain.  You have  shortness of breath with chest pain.  You notice sudden or extreme swelling of your face, hands, ankles, feet, or legs.  Your baby makes fewer than 10 movements in 2 hours.  You have severe headaches that do not go away with medicine.  You have vision changes. Summary  The third trimester is from week 29 through week 40, months 7 through 9. The third trimester is a time when the unborn baby (fetus)   is growing rapidly.  During the third trimester, your discomfort may increase as you and your baby continue to gain weight. You may have abdominal, leg, and back pain, sleeping problems, and an increased need to urinate.  During the third trimester your breasts will keep growing and they will continue to become tender. A yellow fluid (colostrum) may leak from your breasts. This is the first milk you are producing for your baby.  False labor is a condition in which you feel small, irregular tightenings of the muscles in the womb (contractions) that eventually go away. These are called Braxton Hicks contractions. Contractions may last for hours, days, or even weeks before true labor sets in.  Signs of labor can include: abdominal cramps; regular contractions that start at 10 minutes apart and become stronger and more frequent with time; watery or bloody mucus discharge that comes from the vagina; increased pelvic pressure and dull back pain; and leaking of amniotic fluid. This information is not intended to replace advice given to you by your health care provider. Make sure you discuss any questions you have with your health care provider. Document Released: 09/17/2001 Document Revised: 02/29/2016 Document Reviewed: 11/24/2012 Elsevier Interactive Patient Education  2017 [employer]  

## 2016-11-07 NOTE — OB Triage Note (Signed)
Patient evaluated over night for concerns of contractions. Continuous FM throughout the night with reassuring strip per provider. Discharge instructions reviewed with patient including when to return to hospital. Patient and husband state understanding of when to come back. PIV removed and all paperwork given to patient. Encouraged to keep her next appointment and/or return to MAU.

## 2016-11-07 NOTE — Progress Notes (Signed)
Patient given and read all discharge instructions. States understanding. Patient left the unit accompanied by husband.

## 2016-11-07 NOTE — Discharge Summary (Signed)
Physician Discharge Summary  Patient ID: Jean Cabrera MRN: [id number] DOB/AGE: 1981-09-24 36 y.o.  Admit date: 11/06/2016 Discharge date: 11/07/2016  Admission Diagnoses: Latent labor  Discharge Diagnoses:  Active Problems:   Pregnant and not yet delivered   Uterine contractions during pregnancy False labor  Discharged Condition: good  Hospital Course:  This is a 36yo G5P3 patient at 37+3 weeks w/ hx of vag del x 2 followed by pLTCS desiring TOLAC this preg who presented initially for a labor check. Patient presented to the MAU where she was found to be 1.5 cm on cervical exam. She was monitored and found to change to 2.5 after an hour of observation. An additional hour of observation revealed an exam of 3.5. She had regular uterine contractions every 3-4 minutes. FHT was also concerning for decreased variability. Given that she is multiparous, she was admitted and observed overnight. The FHT remained very reactive throughout the rest of the night. She reported good fetal movement, no leakage of fluid, no vaginal bleeding. The contractions persisted, but were rated tolerable. The decision was made to allow the patient to be discharged home and return if she had signs or symptoms of labor progression. The patient and her husband voiced understanding and agreement with the plan of care.   Consults: None  Significant Diagnostic Studies: None  Treatments: Observation and fetal monitoring  Discharge Exam: Blood pressure 128/82, pulse 81, temperature 98.2 F (36.8 C), temperature source Oral, resp. rate 18, height 5\' 3"  (1.6 m), weight 209 lb (94.8 kg), last menstrual period 02/19/2016, SpO2 99 %, unknown if currently breastfeeding. General: well, appearing with no acute distress HEENT: normocephalic, moist mucus membranes, normal conjunctivae Cardiac: RRR, no murmur, no edema Respiratory: LCTAB. No increased work of breathing, stable on room air Abdomen: soft, non-tender, bowel sounds  active, gravid and consistent with gestational age Extremities: moving all extremities appropriately with good strength Skin: no rash or lesions Neuro: no focal defects. Alert and oriented  Cervical exam: 3.5/60/-3  Disposition: 01-Home or Self Care  Discharge Instructions    Call MD for:  extreme fatigue    Complete by:  As directed    Call MD for:  persistant dizziness or light-headedness    Complete by:  As directed    Call MD for:  persistant nausea and vomiting    Complete by:  As directed    Call MD for:  severe uncontrolled pain    Complete by:  As directed    Call MD for:  temperature >100.4    Complete by:  As directed    Diet - low sodium heart healthy    Complete by:  As directed    Discharge instructions    Complete by:  As directed    Return with decreased fetal movement, regular contractions, leakage of fluid or vaginal bleeding   Increase activity slowly    Complete by:  As directed      Allergies as of 11/07/2016      Reactions   Food Anaphylaxis, Other (See Comments)   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     Latex Itching      Medication List    TAKE these medications   ampicillin 500 MG capsule Commonly known as:  PRINCIPEN Take 1 capsule (500 mg total) by mouth 4 (four) times daily.   prenatal multivitamin Tabs tablet Take 1 tablet by mouth daily at 12 noon.      Follow-up Information    [facility] Follow  up.   Why:  Follow-up at previously scheduled follow-up appointment Contact information: [address] [address] [phone]           Signed: Crissie Sickles 11/07/2016, 6:35 AM   CNM attestation I have participated in the care of this patient and agree with above documentation in the resident's note.   Jean Cabrera is a 36 y.o. [phone] s/p overnight eval for labor.   Pain is well controlled.   PE:  BP 126/76 (BP Location: Right Arm)   Pulse 76   Temp 98.3 F (36.8 C) (Oral)   Resp 20   Ht 5\' 3"  (1.6 m)    Wt 94.8 kg (209 lb)   LMP 02/19/2016   SpO2 99%   BMI 37.02 kg/m  FHR tracing, Cat 1 Irreg ctx per toco   Recent Labs  11/06/16 2317  HGB 11.1*  HCT 34.3*     Plan: discharge today -- f/u clinic at next scheduled OB visit, or sooner w/ s/s labor   Serita Grammes, CNM 9:16 AM  11/07/2016

## 2016-11-12 ENCOUNTER — Inpatient Hospital Stay (HOSPITAL_COMMUNITY)
Admission: AD | Admit: 2016-11-12 | Discharge: 2016-11-12 | Disposition: A | Discharge status: Home / Self Care | Payer: 59 | Source: Ambulatory Visit | Attending: Family Medicine | Admitting: Family Medicine

## 2016-11-12 ENCOUNTER — Encounter (HOSPITAL_COMMUNITY): Payer: Self-pay

## 2016-11-12 DIAGNOSIS — Z3483 Encounter for supervision of other normal pregnancy, third trimester: Secondary | ICD-10-CM | POA: Insufficient documentation

## 2016-11-12 NOTE — Discharge Instructions (Signed)
Introduction Patient Name: ________________________________________________ Patient Due Date: ____________________ What is a fetal movement count? A fetal movement count is the number of times that you feel your baby move during a certain amount of time. This may also be called a fetal kick count. A fetal movement count is recommended for every pregnant woman. You may be asked to start counting fetal movements as early as week 28 of your pregnancy. Pay attention to when your baby is most active. You may notice your baby's sleep and wake cycles. You may also notice things that make your baby move more. You should do a fetal movement count:  When your baby is normally most active.  At the same time each day. A good time to count movements is while you are resting, after having something to eat and drink. How do I count fetal movements? 1. Find a quiet, comfortable area. Sit, or lie down on your side. 2. Write down the date, the start time and stop time, and the number of movements that you felt between those two times. Take this information with you to your health care visits. 3. For 2 hours, count kicks, flutters, swishes, rolls, and jabs. You should feel at least 10 movements during 2 hours. 4. You may stop counting after you have felt 10 movements. 5. If you do not feel 10 movements in 2 hours, have something to eat and drink. Then, keep resting and counting for 1 hour. If you feel at least 4 movements during that hour, you may stop counting. Contact a health care provider if:  You feel fewer than 4 movements in 2 hours.  Your baby is not moving like he or she usually does. Date: ____________ Start time: ____________ Stop time: ____________ Movements: ____________ Date: ____________ Start time: ____________ Stop time: ____________ Movements: ____________ Date: ____________ Start time: ____________ Stop time: ____________ Movements: ____________ Date: ____________ Start time: ____________  Stop time: ____________ Movements: ____________ Date: ____________ Start time: ____________ Stop time: ____________ Movements: ____________ Date: ____________ Start time: ____________ Stop time: ____________ Movements: ____________ Date: ____________ Start time: ____________ Stop time: ____________ Movements: ____________ Date: ____________ Start time: ____________ Stop time: ____________ Movements: ____________ Date: ____________ Start time: ____________ Stop time: ____________ Movements: ____________ This information is not intended to replace advice given to you by your health care provider. Make sure you discuss any questions you have with your health care provider. Document Released: 10/23/2006 Document Revised: 05/22/2016 Document Reviewed: 11/02/2015 Elsevier Interactive Patient Education  2017 [place] of Pregnancy The third trimester is from week 29 through week 40 (months 7 through 9). The third trimester is a time when the unborn baby (fetus) is growing rapidly. At the end of the ninth month, the fetus is about 20 inches in length and weighs 6-10 pounds. Body changes during your third trimester Your body goes through many changes during pregnancy. The changes vary from woman to woman. During the third trimester:  Your weight will continue to increase. You can expect to gain 25-35 pounds (11-16 kg) by the end of the pregnancy.  You may begin to get stretch marks on your hips, abdomen, and breasts.  You may urinate more often because the fetus is moving lower into your pelvis and pressing on your bladder.  You may develop or continue to have heartburn. This is caused by increased hormones that slow down muscles in the digestive tract.  You may develop or continue to have constipation because increased hormones slow digestion and cause the  muscles that push waste through your intestines to relax.  You may develop hemorrhoids. These are swollen veins (varicose  veins) in the rectum that can itch or be painful.  You may develop swollen, bulging veins (varicose veins) in your legs.  You may have increased body aches in the pelvis, back, or thighs. This is due to weight gain and increased hormones that are relaxing your joints.  You may have changes in your hair. These can include thickening of your hair, rapid growth, and changes in texture. Some women also have hair loss during or after pregnancy, or hair that feels dry or thin. Your hair will most likely return to normal after your baby is born.  Your breasts will continue to grow and they will continue to become tender. A yellow fluid (colostrum) may leak from your breasts. This is the first milk you are producing for your baby.  Your belly button may stick out.  You may notice more swelling in your hands, face, or ankles.  You may have increased tingling or numbness in your hands, arms, and legs. The skin on your belly may also feel numb.  You may feel short of breath because of your expanding uterus.  You may have more problems sleeping. This can be caused by the size of your belly, increased need to urinate, and an increase in your body's metabolism.  You may notice the fetus "dropping," or moving lower in your abdomen.  You may have increased vaginal discharge.  Your cervix becomes thin and soft (effaced) near your due date. What to expect at prenatal visits You will have prenatal exams every 2 weeks until week 36. Then you will have weekly prenatal exams. During a routine prenatal visit:  You will be weighed to make sure you and the fetus are growing normally.  Your blood pressure will be taken.  Your abdomen will be measured to track your baby's growth.  The fetal heartbeat will be listened to.  Any test results from the previous visit will be discussed.  You may have a cervical check near your due date to see if you have effaced. At around 36 weeks, your health care provider  will check your cervix. At the same time, your health care provider will also perform a test on the secretions of the vaginal tissue. This test is to determine if a type of bacteria, Group B streptococcus, is present. Your health care provider will explain this further. Your health care provider may ask you:  What your birth plan is.  How you are feeling.  If you are feeling the baby move.  If you have had any abnormal symptoms, such as leaking fluid, bleeding, severe headaches, or abdominal cramping.  If you are using any tobacco products, including cigarettes, chewing tobacco, and electronic cigarettes.  If you have any questions. Other tests or screenings that may be performed during your third trimester include:  Blood tests that check for low iron levels (anemia).  Fetal testing to check the health, activity level, and growth of the fetus. Testing is done if you have certain medical conditions or if there are problems during the pregnancy.  Nonstress test (NST). This test checks the health of your baby to make sure there are no signs of problems, such as the baby not getting enough oxygen. During this test, a belt is placed around your belly. The baby is made to move, and its heart rate is monitored during movement. What is false labor? False labor  is a condition in which you feel small, irregular tightenings of the muscles in the womb (contractions) that eventually go away. These are called Braxton Hicks contractions. Contractions may last for hours, days, or even weeks before true labor sets in. If contractions come at regular intervals, become more frequent, increase in intensity, or become painful, you should see your health care provider. What are the signs of labor?  Abdominal cramps.  Regular contractions that start at 10 minutes apart and become stronger and more frequent with time.  Contractions that start on the top of the uterus and spread down to the lower abdomen and  back.  Increased pelvic pressure and dull back pain.  A watery or bloody mucus discharge that comes from the vagina.  Leaking of amniotic fluid. This is also known as your "water breaking." It could be a slow trickle or a gush. Let your doctor know if it has a color or strange odor. If you have any of these signs, call your health care provider right away, even if it is before your due date. Follow these instructions at home: Eating and drinking  Continue to eat regular, healthy meals.  Do not eat:  Raw meat or meat spreads.  Unpasteurized milk or cheese.  Unpasteurized juice.  Store-made salad.  Refrigerated smoked seafood.  Hot dogs or deli meat, unless they are piping hot.  More than 6 ounces of albacore tuna a week.  Shark, swordfish, king mackerel, or tile fish.  Store-made salads.  Raw sprouts, such as mung bean or alfalfa sprouts.  Take prenatal vitamins as told by your health care provider.  Take 1000 mg of calcium daily as told by your health care provider.  If you develop constipation:  Take over-the-counter or prescription medicines.  Drink enough fluid to keep your urine clear or pale yellow.  Eat foods that are high in fiber, such as fresh fruits and vegetables, whole grains, and beans.  Limit foods that are high in fat and processed sugars, such as fried and sweet foods. Activity  Exercise only as directed by your health care provider. Healthy pregnant women should aim for 2 hours and 30 minutes of moderate exercise per week. If you experience any pain or discomfort while exercising, stop.  Avoid heavy lifting.  Do not exercise in extreme heat or humidity, or at high altitudes.  Wear low-heel, comfortable shoes.  Practice good posture.  Do not travel far distances unless it is absolutely necessary and only with the approval of your health care provider.  Wear your seat belt at all times while in a car, on a bus, or on a plane.  Take  frequent breaks and rest with your legs elevated if you have leg cramps or low back pain.  Do not use hot tubs, steam rooms, or saunas.  You may continue to have sex unless your health care provider tells you otherwise. Lifestyle  Do not use any products that contain nicotine or tobacco, such as cigarettes and e-cigarettes. If you need help quitting, ask your health care provider.  Do not drink alcohol.  Do not use any medicinal herbs or unprescribed drugs. These chemicals affect the formation and growth of the baby.  If you develop varicose veins:  Wear support pantyhose or compression stockings as told by your healthcare provider.  Elevate your feet for 15 minutes, 3-4 times a day.  Wear a supportive maternity bra to help with breast tenderness. General instructions  Take over-the-counter and prescription medicines only  as told by your health care provider. There are medicines that are either safe or unsafe to take during pregnancy.  Take warm sitz baths to soothe any pain or discomfort caused by hemorrhoids. Use hemorrhoid cream or witch hazel if your health care provider approves.  Avoid cat litter boxes and soil used by cats. These carry germs that can cause birth defects in the baby. If you have a cat, ask someone to clean the litter box for you.  To prepare for the arrival of your baby:  Take prenatal classes to understand, practice, and ask questions about the labor and delivery.  Make a trial run to the hospital.  Visit the hospital and tour the maternity area.  Arrange for maternity or paternity leave through employers.  Arrange for family and friends to take care of pets while you are in the hospital.  Purchase a rear-facing car seat and make sure you know how to install it in your car.  Pack your hospital bag.  Prepare the babys nursery. Make sure to remove all pillows and stuffed animals from the baby's crib to prevent suffocation.  Visit your dentist if  you have not gone during your pregnancy. Use a soft toothbrush to brush your teeth and be gentle when you floss.  Keep all prenatal follow-up visits as told by your health care provider. This is important. Contact a health care provider if:  You are unsure if you are in labor or if your water has broken.  You become dizzy.  You have mild pelvic cramps, pelvic pressure, or nagging pain in your abdominal area.  You have lower back pain.  You have persistent nausea, vomiting, or diarrhea.  You have an unusual or bad smelling vaginal discharge.  You have pain when you urinate. Get help right away if:  You have a fever.  You are leaking fluid from your vagina.  You have spotting or bleeding from your vagina.  You have severe abdominal pain or cramping.  You have rapid weight loss or weight gain.  You have shortness of breath with chest pain.  You notice sudden or extreme swelling of your face, hands, ankles, feet, or legs.  Your baby makes fewer than 10 movements in 2 hours.  You have severe headaches that do not go away with medicine.  You have vision changes. Summary  The third trimester is from week 29 through week 40, months 7 through 9. The third trimester is a time when the unborn baby (fetus) is growing rapidly.  During the third trimester, your discomfort may increase as you and your baby continue to gain weight. You may have abdominal, leg, and back pain, sleeping problems, and an increased need to urinate.  During the third trimester your breasts will keep growing and they will continue to become tender. A yellow fluid (colostrum) may leak from your breasts. This is the first milk you are producing for your baby.  False labor is a condition in which you feel small, irregular tightenings of the muscles in the womb (contractions) that eventually go away. These are called Braxton Hicks contractions. Contractions may last for hours, days, or even weeks before true  labor sets in.  Signs of labor can include: abdominal cramps; regular contractions that start at 10 minutes apart and become stronger and more frequent with time; watery or bloody mucus discharge that comes from the vagina; increased pelvic pressure and dull back pain; and leaking of amniotic fluid. This information is not intended to  replace advice given to you by your health care provider. Make sure you discuss any questions you have with your health care provider. Document Released: 09/17/2001 Document Revised: 02/29/2016 Document Reviewed: 11/24/2012 Elsevier Interactive Patient Education  2017 [employer] of the uterus can occur throughout pregnancy. Contractions are not always a sign that you are in labor.  WHAT ARE BRAXTON HICKS CONTRACTIONS?  Contractions that occur before labor are called Braxton Hicks contractions, or false labor. Toward the end of pregnancy (32-34 weeks), these contractions can develop more often and may become more forceful. This is not true labor because these contractions do not result in opening (dilatation) and thinning of the cervix. They are sometimes difficult to tell apart from true labor because these contractions can be forceful and people have different pain tolerances. You should not feel embarrassed if you go to the hospital with false labor. Sometimes, the only way to tell if you are in true labor is for your health care provider to look for changes in the cervix. If there are no prenatal problems or other health problems associated with the pregnancy, it is completely safe to be sent home with false labor and await the onset of true labor. HOW CAN YOU TELL THE DIFFERENCE BETWEEN TRUE AND FALSE LABOR? False Labor   The contractions of false labor are usually shorter and not as hard as those of true labor.   The contractions are usually irregular.   The contractions are often felt in the front of the lower  abdomen and in the groin.   The contractions may go away when you walk around or change positions while lying down.   The contractions get weaker and are shorter lasting as time goes on.   The contractions do not usually become progressively stronger, regular, and closer together as with true labor.  True Labor   Contractions in true labor last 30-70 seconds, become very regular, usually become more intense, and increase in frequency.   The contractions do not go away with walking.   The discomfort is usually felt in the top of the uterus and spreads to the lower abdomen and low back.   True labor can be determined by your health care provider with an exam. This will show that the cervix is dilating and getting thinner.  WHAT TO REMEMBER  Keep up with your usual exercises and follow other instructions given by your health care provider.   Take medicines as directed by your health care provider.   Keep your regular prenatal appointments.   Eat and drink lightly if you think you are going into labor.   If Braxton Hicks contractions are making you uncomfortable:   Change your position from lying down or resting to walking, or from walking to resting.   Sit and rest in a tub of warm water.   Drink 2-3 glasses of water. Dehydration may cause these contractions.   Do slow and deep breathing several times an hour.  WHEN SHOULD I SEEK IMMEDIATE MEDICAL CARE? Seek immediate medical care if:  Your contractions become stronger, more regular, and closer together.   You have fluid leaking or gushing from your vagina.   You have a fever.   You pass blood-tinged mucus.   You have vaginal bleeding.   You have continuous abdominal pain.   You have low back pain that you never had before.   You feel your baby's head pushing down and causing pelvic pressure.  Your baby is not moving as much as it used to.  This information is not intended to replace  advice given to you by your health care provider. Make sure you discuss any questions you have with your health care provider. Document Released: 09/23/2005 Document Revised: 01/15/2016 Document Reviewed: 07/05/2013 Elsevier Interactive Patient Education  2017 [employer].

## 2016-11-14 DIAGNOSIS — Z789 Other specified health status: Secondary | ICD-10-CM | POA: Diagnosis not present

## 2016-11-15 ENCOUNTER — Ambulatory Visit (HOSPITAL_COMMUNITY)
Admission: RE | Admit: 2016-11-15 | Discharge: 2016-11-15 | Disposition: A | Discharge status: Home / Self Care | Payer: 59 | Source: Ambulatory Visit | Attending: Nurse Practitioner | Admitting: Nurse Practitioner

## 2016-11-15 ENCOUNTER — Other Ambulatory Visit (HOSPITAL_COMMUNITY): Payer: Self-pay | Admitting: Nurse Practitioner

## 2016-11-15 ENCOUNTER — Telehealth (HOSPITAL_COMMUNITY): Payer: Self-pay | Admitting: *Deleted

## 2016-11-15 DIAGNOSIS — D259 Leiomyoma of uterus, unspecified: Secondary | ICD-10-CM

## 2016-11-15 DIAGNOSIS — O480 Post-term pregnancy: Secondary | ICD-10-CM

## 2016-11-15 DIAGNOSIS — O34219 Maternal care for unspecified type scar from previous cesarean delivery: Secondary | ICD-10-CM

## 2016-11-15 DIAGNOSIS — Z3A40 40 weeks gestation of pregnancy: Secondary | ICD-10-CM

## 2016-11-15 DIAGNOSIS — O3413 Maternal care for benign tumor of corpus uteri, third trimester: Secondary | ICD-10-CM | POA: Insufficient documentation

## 2016-11-15 NOTE — Telephone Encounter (Signed)
Preadmission screen  

## 2016-11-17 ENCOUNTER — Inpatient Hospital Stay (HOSPITAL_COMMUNITY): Payer: 59

## 2016-11-18 ENCOUNTER — Encounter (HOSPITAL_COMMUNITY): Payer: Self-pay | Admitting: *Deleted

## 2016-11-18 ENCOUNTER — Inpatient Hospital Stay (HOSPITAL_COMMUNITY)
Admission: AD | Admit: 2016-11-18 | Discharge: 2016-11-20 | DRG: 775 | Disposition: A | Discharge status: Home / Self Care | Payer: 59 | Source: Ambulatory Visit | Attending: Obstetrics and Gynecology | Admitting: Obstetrics and Gynecology

## 2016-11-18 ENCOUNTER — Inpatient Hospital Stay (HOSPITAL_COMMUNITY): Payer: 59 | Admitting: Anesthesiology

## 2016-11-18 DIAGNOSIS — Z3A41 41 weeks gestation of pregnancy: Secondary | ICD-10-CM

## 2016-11-18 DIAGNOSIS — O34211 Maternal care for low transverse scar from previous cesarean delivery: Secondary | ICD-10-CM | POA: Diagnosis present

## 2016-11-18 DIAGNOSIS — O480 Post-term pregnancy: Secondary | ICD-10-CM

## 2016-11-18 DIAGNOSIS — Z3493 Encounter for supervision of normal pregnancy, unspecified, third trimester: Secondary | ICD-10-CM | POA: Diagnosis present

## 2016-11-18 DIAGNOSIS — O34219 Maternal care for unspecified type scar from previous cesarean delivery: Secondary | ICD-10-CM

## 2016-11-18 LAB — CBC
HCT: 35.5 % — ABNORMAL LOW (ref 36.0–46.0)
Hemoglobin: 11.5 g/dL — ABNORMAL LOW (ref 12.0–15.0)
MCH: 25.6 pg — ABNORMAL LOW (ref 26.0–34.0)
MCHC: 32.4 g/dL (ref 30.0–36.0)
MCV: 79.1 fL (ref 78.0–100.0)
PLATELETS: 359 10*3/uL (ref 150–400)
RBC: 4.49 MIL/uL (ref 3.87–5.11)
RDW: 16.1 % — AB (ref 11.5–15.5)
WBC: 6.3 10*3/uL (ref 4.0–10.5)

## 2016-11-18 LAB — TYPE AND SCREEN
ABO/RH(D): O POS
ANTIBODY SCREEN: NEGATIVE

## 2016-11-18 LAB — RPR: RPR Ser Ql: NONREACTIVE

## 2016-11-18 MED ORDER — PHENYLEPHRINE 40 MCG/ML (10ML) SYRINGE FOR IV PUSH (FOR BLOOD PRESSURE SUPPORT): 80.0000 ug | PREFILLED_SYRINGE | INTRAVENOUS | Status: DC | PRN

## 2016-11-18 MED ORDER — IBUPROFEN 600 MG PO TABS
600.0000 mg | ORAL_TABLET | Freq: Four times a day (QID) | ORAL | Status: DC
  Administered 2016-11-18 – 2016-11-20 (×7): 600 mg via ORAL
  Filled 2016-11-18 (×7): qty 1

## 2016-11-18 MED ORDER — FLEET ENEMA 7-19 GM/118ML RE ENEM: 1.0000 | ENEMA | RECTAL | Status: DC | PRN

## 2016-11-18 MED ORDER — WITCH HAZEL-GLYCERIN EX PADS: 1.0000 "application " | MEDICATED_PAD | CUTANEOUS | Status: DC | PRN

## 2016-11-18 MED ORDER — ONDANSETRON HCL 4 MG/2ML IJ SOLN
4.0000 mg | Freq: Four times a day (QID) | INTRAMUSCULAR | Status: DC | PRN
  Administered 2016-11-18: 4 mg via INTRAVENOUS
  Filled 2016-11-18: qty 2

## 2016-11-18 MED ORDER — SOD CITRATE-CITRIC ACID 500-334 MG/5ML PO SOLN: 30.0000 mL | ORAL | Status: DC | PRN

## 2016-11-18 MED ORDER — TETANUS-DIPHTH-ACELL PERTUSSIS 5-2.5-18.5 LF-MCG/0.5 IM SUSP: 0.5000 mL | Freq: Once | INTRAMUSCULAR | Status: DC

## 2016-11-18 MED ORDER — SIMETHICONE 80 MG PO CHEW: 80.0000 mg | CHEWABLE_TABLET | ORAL | Status: DC | PRN

## 2016-11-18 MED ORDER — ONDANSETRON HCL 4 MG/2ML IJ SOLN: 4.0000 mg | INTRAMUSCULAR | Status: DC | PRN

## 2016-11-18 MED ORDER — OXYTOCIN 40 UNITS IN LACTATED RINGERS INFUSION - SIMPLE MED
2.5000 [IU]/h | INTRAVENOUS | Status: DC
  Filled 2016-11-18: qty 1000

## 2016-11-18 MED ORDER — ONDANSETRON HCL 4 MG PO TABS: 4.0000 mg | ORAL_TABLET | ORAL | Status: DC | PRN

## 2016-11-18 MED ORDER — LACTATED RINGERS IV SOLN: 500.0000 mL | Freq: Once | INTRAVENOUS | Status: DC

## 2016-11-18 MED ORDER — DIBUCAINE 1 % RE OINT: 1.0000 "application " | TOPICAL_OINTMENT | RECTAL | Status: DC | PRN

## 2016-11-18 MED ORDER — OXYTOCIN BOLUS FROM INFUSION
500.0000 mL | Freq: Once | INTRAVENOUS | Status: AC
  Administered 2016-11-18: 500 mL via INTRAVENOUS

## 2016-11-18 MED ORDER — DIPHENHYDRAMINE HCL 25 MG PO CAPS: 25.0000 mg | ORAL_CAPSULE | Freq: Four times a day (QID) | ORAL | Status: DC | PRN

## 2016-11-18 MED ORDER — OXYCODONE-ACETAMINOPHEN 5-325 MG PO TABS
1.0000 | ORAL_TABLET | ORAL | Status: DC | PRN
Start: 2016-11-18 — End: 2016-11-18

## 2016-11-18 MED ORDER — BENZOCAINE-MENTHOL 20-0.5 % EX AERO
1.0000 "application " | INHALATION_SPRAY | CUTANEOUS | Status: DC | PRN
  Filled 2016-11-18: qty 56

## 2016-11-18 MED ORDER — LACTATED RINGERS IV SOLN
500.0000 mL | Freq: Once | INTRAVENOUS | Status: AC
  Administered 2016-11-18: 500 mL via INTRAVENOUS

## 2016-11-18 MED ORDER — EPHEDRINE 5 MG/ML INJ
10.0000 mg | INTRAVENOUS | Status: DC | PRN
  Filled 2016-11-18: qty 4

## 2016-11-18 MED ORDER — PHENYLEPHRINE 40 MCG/ML (10ML) SYRINGE FOR IV PUSH (FOR BLOOD PRESSURE SUPPORT)
80.0000 ug | PREFILLED_SYRINGE | INTRAVENOUS | Status: DC | PRN
  Filled 2016-11-18: qty 10
  Filled 2016-11-18: qty 5

## 2016-11-18 MED ORDER — ZOLPIDEM TARTRATE 5 MG PO TABS: 5.0000 mg | ORAL_TABLET | Freq: Every evening | ORAL | Status: DC | PRN

## 2016-11-18 MED ORDER — COCONUT OIL OIL: 1.0000 "application " | TOPICAL_OIL | Status: DC | PRN

## 2016-11-18 MED ORDER — OXYCODONE-ACETAMINOPHEN 5-325 MG PO TABS
2.0000 | ORAL_TABLET | ORAL | Status: DC | PRN
  Administered 2016-11-19: 2 via ORAL
  Filled 2016-11-18: qty 2

## 2016-11-18 MED ORDER — PRENATAL MULTIVITAMIN CH
1.0000 | ORAL_TABLET | Freq: Every day | ORAL | Status: DC
  Administered 2016-11-19: 1 via ORAL
  Filled 2016-11-18: qty 1

## 2016-11-18 MED ORDER — LIDOCAINE HCL (PF) 1 % IJ SOLN
INTRAMUSCULAR | Status: DC | PRN
  Administered 2016-11-18: 2 mL
  Administered 2016-11-18: 5 mL
  Administered 2016-11-18: 3 mL

## 2016-11-18 MED ORDER — OXYCODONE-ACETAMINOPHEN 5-325 MG PO TABS
2.0000 | ORAL_TABLET | ORAL | Status: DC | PRN
Start: 2016-11-18 — End: 2016-11-18

## 2016-11-18 MED ORDER — DIPHENHYDRAMINE HCL 50 MG/ML IJ SOLN: 12.5000 mg | INTRAMUSCULAR | Status: DC | PRN

## 2016-11-18 MED ORDER — LACTATED RINGERS IV SOLN
500.0000 mL | INTRAVENOUS | Status: DC | PRN
  Administered 2016-11-18: 500 mL via INTRAVENOUS

## 2016-11-18 MED ORDER — FENTANYL CITRATE (PF) 100 MCG/2ML IJ SOLN
100.0000 ug | INTRAMUSCULAR | Status: DC | PRN
  Administered 2016-11-18: 100 ug via INTRAVENOUS

## 2016-11-18 MED ORDER — LACTATED RINGERS IV SOLN
INTRAVENOUS | Status: DC
  Administered 2016-11-18 (×3): via INTRAVENOUS

## 2016-11-18 MED ORDER — FENTANYL 2.5 MCG/ML BUPIVACAINE 1/10 % EPIDURAL INFUSION (WH - ANES)
14.0000 mL/h | INTRAMUSCULAR | Status: DC | PRN
  Administered 2016-11-18: 14 mL/h via EPIDURAL
  Filled 2016-11-18: qty 100

## 2016-11-18 MED ORDER — PHENYLEPHRINE 40 MCG/ML (10ML) SYRINGE FOR IV PUSH (FOR BLOOD PRESSURE SUPPORT)
80.0000 ug | PREFILLED_SYRINGE | INTRAVENOUS | Status: DC | PRN
  Filled 2016-11-18: qty 5

## 2016-11-18 MED ORDER — ACETAMINOPHEN 325 MG PO TABS: 650.0000 mg | ORAL_TABLET | ORAL | Status: DC | PRN

## 2016-11-18 MED ORDER — EPHEDRINE 5 MG/ML INJ: 10.0000 mg | INTRAVENOUS | Status: DC | PRN

## 2016-11-18 MED ORDER — TERBUTALINE SULFATE 1 MG/ML IJ SOLN
0.2500 mg | Freq: Once | INTRAMUSCULAR | Status: DC | PRN
  Filled 2016-11-18: qty 1

## 2016-11-18 MED ORDER — SENNOSIDES-DOCUSATE SODIUM 8.6-50 MG PO TABS
2.0000 | ORAL_TABLET | ORAL | Status: DC
  Administered 2016-11-18 – 2016-11-20 (×2): 2 via ORAL
  Filled 2016-11-18 (×2): qty 2

## 2016-11-18 MED ORDER — OXYTOCIN 40 UNITS IN LACTATED RINGERS INFUSION - SIMPLE MED
1.0000 m[IU]/min | INTRAVENOUS | Status: DC
  Administered 2016-11-18: 1 m[IU]/min via INTRAVENOUS

## 2016-11-18 MED ORDER — OXYCODONE-ACETAMINOPHEN 5-325 MG PO TABS
1.0000 | ORAL_TABLET | ORAL | Status: DC | PRN
  Administered 2016-11-18: 1 via ORAL
  Filled 2016-11-18: qty 1

## 2016-11-18 MED ORDER — LIDOCAINE HCL (PF) 1 % IJ SOLN
30.0000 mL | INTRAMUSCULAR | Status: DC | PRN
  Filled 2016-11-18: qty 30

## 2016-11-18 MED ORDER — FENTANYL CITRATE (PF) 100 MCG/2ML IJ SOLN
50.0000 ug | INTRAMUSCULAR | Status: DC | PRN
  Administered 2016-11-18: 100 ug via INTRAVENOUS
  Filled 2016-11-18 (×2): qty 2

## 2016-11-18 NOTE — Progress Notes (Signed)
Jean Cabrera is a 36 y.o. [id number] at [redacted]w[redacted]d  admitted  Subjective:  Patient very uncomfortable. Feeling a lot of pressure and uncontrollable urge to push.  Objective: BP (!) 132/94   Pulse (!) 104   Temp 97.9 F (36.6 C) (Oral)   Resp 16   Ht 5' 3.5" (1.613 m)   Wt 207 lb (93.9 kg)   LMP 02/19/2016   SpO2 100%   BMI 36.09 kg/m  No intake/output data recorded. No intake/output data recorded.  FHT:  FHR: 135 bpm, variability: moderate,  accelerations:  Present,  decelerations:  Present variables  UC:   irregular, every 2-3 minutes SVE:   Dilation: Lip/rim Effacement (%): 100 Station: +1 Exam by:: Thurmond Hildebran,cnm  Labs: Lab Results  Component Value Date   WBC 6.3 11/18/2016   HGB 11.5 (L) 11/18/2016   HCT 35.5 (L) 11/18/2016   MCV 79.1 11/18/2016   PLT 359 11/18/2016    Assessment / Plan: protracted active phase.   Labor: Will start pitocin now. Have attempted multiple position changes, hands and knees/side to side.  Preeclampsia:  NA Fetal Wellbeing:  Category II Pain Control:  Epidural I/D:  n/a Anticipated MOD:  guarded for now. Dr. Rip Harbour notified to come evaluate the patient.   Mathis Bud 11/18/2016, 3:37 PM

## 2016-11-18 NOTE — MAU Note (Signed)
Pt reports contractions since 2am every 2 mins. Denies LOF or vag bleeding. +FM. Cervix was 3.5 on last exam

## 2016-11-18 NOTE — Progress Notes (Signed)
Jean Cabrera is a 36 y.o. [phone] at [redacted]w[redacted]d admitted for spontaneous onset of labor.  Subjective:  Mostly comfortable with epidural. Some pressure, and pain on one side.  Objective: BP 113/66   Pulse 84   Temp 98.1 F (36.7 C) (Oral)   Resp 16   Ht 5' 3.5" (1.613 m)   Wt 207 lb (93.9 kg)   LMP 02/19/2016   SpO2 100%   BMI 36.09 kg/m  No intake/output data recorded. No intake/output data recorded.  FHT:  FHR: 140 bpm, variability: moderate,  accelerations:  Abscent,  decelerations:  Present variables with UCs  UC:   irregular, every 2-3 minutes SVE:   Dilation: 9 Effacement (%): 100 (swollen on right side) Station: 0 Exam by:: [employer]: Lab Results  Component Value Date   WBC 6.3 11/18/2016   HGB 11.5 (L) 11/18/2016   HCT 35.5 (L) 11/18/2016   MCV 79.1 11/18/2016   PLT 359 11/18/2016    Assessment / Plan: Spontaneous labor, progressing normally  Labor: Progressing normally Preeclampsia:  NA Fetal Wellbeing:  Category II Pain Control:  Epidural I/D:  n/a Anticipated MOD:  NSVD  Mathis Bud 11/18/2016, 2:21 PM

## 2016-11-18 NOTE — Progress Notes (Signed)
Patient ID: Jean Cabrera, female   DOB: [birth year], 36 y.o.   MRN: [id number] Dr. Rip Harbour notified of patient status. Variables with contractions with contractions. Slow, but steady change throughout the day. Will call for standby for delivery.

## 2016-11-18 NOTE — Lactation Note (Signed)
This note was copied from a baby's chart. Lactation Consultation Note Mom holding infant STS.  Mom states she has BF experience.  Dad in room very supportive of breastfeeding.  Mom states she has a 66, 31, and 34 year old that she breastfed.  She states she bf some for 3 years but the 34 year old weaned himself during this last pregnancy.  She states she had a lump she felt in her inner right breast back in June 2017.  She had it biopsied and a titanium tag was placed.  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  Pt. States that it drained old milk and that the titanium tag came out in the drainage/clot of old milk.  She states that she was placed on antibiotics and had a allergic reaction then was put on another antibiotic.  Pt. Denies pain at site but expresses slight sensitivity.  Bandaid was on inner lower right breast.  No swelling or abnormal tissue noted on either breast upon examination.  Mom attempts to put baby to right breast and LC easily hand expressed with mom's permission.  Colostrum was clear and several drops came and baby was put to breast.  Baby would not suck but did lick the colostrum.  No latch was obtained.  LC changed baby's position from football to cross cradle but baby would not suck.  LC tried to finger feed infant colostrum from gloved finger but baby just licked.  Baby was alert and would open mouth for finger but would not latch to breast at this time.  LC encouraged mom to do STS and to hand express.  BF basics reviewed with mom and dad.  Mom was given brochure for lactation as well as BF support group info and resource sheet.  Mom stated she would call out for further assistance or concerns or questions regarding infant feeding.  Lakeline notified nursery admission nurse about infant not latching and attempts at getting infant to breast.    Patient Name: Jean Cabrera Today's Date:  11/18/2016 Reason for consult: Initial assessment   Maternal Data Formula Feeding for Exclusion: No Has patient been taught Hand Expression?: Yes (mom states she knows how to hand express, LC demonstrated and gtts of colostrum seen) Does the patient have breastfeeding experience prior to this delivery?: Yes  Feeding Feeding Type: Breast Fed Length of feed: 0 min  LATCH Score/Interventions Latch: Repeated attempts needed to sustain latch, nipple held in mouth throughout feeding, stimulation needed to elicit sucking reflex. Intervention(s): Adjust position;Assist with latch  Audible Swallowing: A few with stimulation Intervention(s): Skin to skin  Type of Nipple: Everted at rest and after stimulation  Comfort (Breast/Nipple): Soft / non-tender     Hold (Positioning): No assistance needed to correctly position infant at breast.  LATCH Score: 8  Lactation Tools Discussed/Used     Consult Status Consult Status: Follow-up Date: 11/19/16 Follow-up type: In-patient    Ferne Coe [facility] 11/18/2016, 5:33 PM

## 2016-11-18 NOTE — H&P (Signed)
LABOR AND DELIVERY ADMISSION HISTORY AND PHYSICAL NOTE  Jean Cabrera is a 36 y.o. female [phone] with IUP at [redacted]w[redacted]d by [place] presenting for SOL. Patient reports having contractions that were increasing in frequency and intensity around 0200. In the MAU she was 5cm dilated, 70% effaced and -2 station.  Denies HA, changes in vision, CP, SOB, NVD or LE edema. She reports positive fetal movement. She denies leakage of fluid or vaginal bleeding.   Past Medical History: Past Medical History:  Diagnosis Date  . Abnormal Pap smear   . Anemia   . Late prenatal care    13 weeks  . Myringotomy tube status   . Uterine fibroids affecting pregnancy     Past Surgical History: Past Surgical History:  Procedure Laterality Date  . CESAREAN SECTION N/A 04/09/2014   Procedure: CESAREAN SECTION;  Surgeon: Farrel Gobble. Harrington Challenger, MD;  Location: [facility];  Service: Obstetrics;  Laterality: N/A;  . COLPOSCOPY    . DILATION AND CURETTAGE OF UTERUS    . tubes in ear      Obstetrical History: OB History    Gravida Para Term Preterm AB Living   5 3 3   1 3    SAB TAB Ectopic Multiple Live Births   0 1   1 3       Social History: Social History   Social History  . Marital status: Married    Spouse name: N/A  . Number of children: N/A  . Years of education: N/A   Social History Main Topics  . Smoking status: Never Smoker  . Smokeless tobacco: Never Used  . Alcohol use No  . Drug use: No  . Sexual activity: Yes   Other Topics Concern  . None   Social History Narrative  . None    Family History: Family History  Problem Relation Age of Onset  . Thyroid disease Paternal Aunt   . COPD Paternal Aunt     thyroid cancer  . Alcohol abuse Paternal Aunt   . Drug abuse Paternal Aunt   . Mental illness Cousin     bipolar  . Alcohol abuse Father   . Drug abuse Father     Allergies: Allergies  Allergen Reactions  . 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    Marland Kitchen Keflex  [Cephalexin] Nausea And Vomiting    Lightheadedness   . Latex Itching    Prescriptions Prior to Admission  Medication Sig Dispense Refill Last Dose  . ampicillin (PRINCIPEN) 500 MG capsule Take 1 capsule (500 mg total) by mouth 4 (four) times daily. 40 capsule 0 Past Week at Unknown time  . Prenatal Vit-Fe Fumarate-FA (PRENATAL MULTIVITAMIN) TABS tablet Take 1 tablet by mouth daily at 12 noon.   Past Week at Unknown time     Review of Systems   All systems reviewed and negative except as stated in HPI  Blood pressure 113/66, pulse 84, temperature 97.9 F (36.6 C), temperature source Oral, resp. rate 20, height 5' 3.5" (1.613 m), weight 93.9 kg (207 lb), last menstrual period 02/19/2016, SpO2 100 %, unknown if currently breastfeeding. General appearance: alert and cooperative Lungs: clear to auscultation bilaterally Heart: regular rate and rhythm Abdomen: soft, non-tender; bowel sounds normal Extremities: No calf swelling or tenderness Presentation: cephalic Fetal monitoring:  Baseline HR 130s, mod variability, pos accels, no decels. Contractions q1.5-63min Uterine activity:  Dilation: 5 Effacement (%): 70 Station: -2 Exam by:: Manya Silvas RN  Prenatal labs: ABO, Rh: --/--/O POS (02/12 0358) Antibody: NEG (02/12 0358) Rubella: too late RPR: Non Reactive (01/31 2317)  HBsAg: Negative (08/17 0000)  HIV: Non-reactive (08/17 0000)  GBS: Negative (01/02 0000)  1 hr Glucola: normal Genetic screening:  Too late for quad and first screen Anatomy US: normal  Prenatal Transfer Tool  Maternal Diabetes: No Genetic Screening: too late Maternal Ultrasounds/Referrals: Normal Fetal Ultrasounds or other Referrals:  None Maternal Substance Abuse:  No Significant Maternal Medications:  None Significant Maternal Lab Results: None  Results for orders placed or performed during the hospital encounter of 11/18/16 (from the past 24 hour(s))  CBC   Collection Time: 11/18/16  3:58 AM   Result Value Ref Range   WBC 6.3 4.0 - 10.5 K/uL   RBC 4.49 3.87 - 5.11 MIL/uL   Hemoglobin 11.5 (L) 12.0 - 15.0 g/dL   HCT 35.5 (L) 36.0 - 46.0 %   MCV 79.1 78.0 - 100.0 fL   MCH 25.6 (L) 26.0 - 34.0 pg   MCHC 32.4 30.0 - 36.0 g/dL   RDW 16.1 (H) 11.5 - 15.5 %   Platelets 359 150 - 400 K/uL  Type and screen Mier   Collection Time: 11/18/16  3:58 AM  Result Value Ref Range   ABO/RH(D) O POS    Antibody Screen NEG    Sample Expiration 11/21/2016     Patient Active Problem List   Diagnosis Date Noted  . Indication for care in labor or delivery 11/18/2016  . Pregnant and not yet delivered 11/07/2016  . Uterine contractions during pregnancy 11/07/2016  . Observation and evaluation of newborn for suspected infectious condition 04/11/2014  . Fever in newborn 04/11/2014  . Potential for hyperbilirubinemia in newborn 04/11/2014  . Cesarean delivery delivered 04/09/2014  . Post term pregnancy, antepartum condition or complication [id number]    Assessment: Jean Cabrera is a 36 y.o. G5P3013 at [redacted]w[redacted]d here for SOL. No loss of fluids or vaginal bleeding.   #Labor: SOL, intact membranes, anticipate SVD #Pain: IV pain medication #FWB:  Cat 1 #ID: GBS neg #MOF: breast #MOC: undecided #Circ: NA  Eloise Levels, MD PGY-1 11/18/2016, 7:17 AM  I was present for the exam and agree with above. Received care at [facility]. TOLAC consent under Media tab. Had two SVD's of ~6-14 babies followed by LTCS of 8-9 baby for arrest of descent, fever and NRFHTs. Strongly desires low-intervention birth.   [place], [place] 11/18/2016 9:37 AM

## 2016-11-18 NOTE — Anesthesia Pain Management Evaluation Note (Signed)
  CRNA Pain Management Visit Note  Patient: Jean Cabrera, 37 y.o., female  "Hello I am a member of the anesthesia team at [facility]. We have an anesthesia team available at all times to provide care throughout the hospital, including epidural management and anesthesia for C-section. I don't know your plan for the delivery whether it a natural birth, water birth, IV sedation, nitrous supplementation, doula or epidural, but we want to meet your pain goals."   1.Was your pain managed to your expectations on prior hospitalizations?   Yes   2.What is your expectation for pain management during this hospitalization?     IV pain meds and Nitrous Oxide  3.How can we help you reach that goal? IV pain meds, N2O  Record the patient's initial score and the patient's pain goal.   Pain: 6--RN aware of patient's desire to try N2O, L&D RN at bedside  Pain Goal: 6 [facility] wants you to be able to say your pain was always managed very well.  Charmelle Soh L 11/18/2016

## 2016-11-18 NOTE — Anesthesia Preprocedure Evaluation (Signed)
Anesthesia Evaluation  Patient identified by MRN, date of birth, ID band Patient awake    Reviewed: Allergy & Precautions, Patient's Chart, lab work & pertinent test results  Airway Mallampati: II  TM Distance: >3 FB Neck ROM: Full    Dental   Pulmonary neg pulmonary ROS,    breath sounds clear to auscultation       Cardiovascular negative cardio ROS   Rhythm:Regular Rate:Normal     Neuro/Psych negative neurological ROS     GI/Hepatic negative GI ROS, Neg liver ROS,   Endo/Other  negative endocrine ROS  Renal/GU negative Renal ROS     Musculoskeletal   Abdominal   Peds  Hematology negative hematology ROS (+)   Anesthesia Other Findings   Reproductive/Obstetrics (+) Pregnancy                             Lab Results  Component Value Date   WBC 6.3 11/18/2016   HGB 11.5 (L) 11/18/2016   HCT 35.5 (L) 11/18/2016   MCV 79.1 11/18/2016   PLT 359 11/18/2016    Anesthesia Physical Anesthesia Plan  ASA: II  Anesthesia Plan: Epidural   Post-op Pain Management:    Induction:   Airway Management Planned: Natural Airway  Additional Equipment:   Intra-op Plan:   Post-operative Plan:   Informed Consent: I have reviewed the patients History and Physical, chart, labs and discussed the procedure including the risks, benefits and alternatives for the proposed anesthesia with the patient or authorized representative who has indicated his/her understanding and acceptance.     Plan Discussed with:   Anesthesia Plan Comments:         Anesthesia Quick Evaluation

## 2016-11-18 NOTE — Anesthesia Procedure Notes (Signed)
Epidural Patient location during procedure: OB Start time: 11/18/2016 11:45 AM End time: 11/18/2016 11:52 AM  Staffing Anesthesiologist: Suzette Battiest Performed: anesthesiologist   Preanesthetic Checklist Completed: patient identified, site marked, surgical consent, pre-op evaluation, timeout performed, IV checked, risks and benefits discussed and monitors and equipment checked  Epidural Patient position: sitting Prep: site prepped and draped and DuraPrep Patient monitoring: continuous pulse ox and blood pressure Approach: midline Location: L4-L5 Injection technique: LOR air  Needle:  Needle type: Tuohy  Needle gauge: 17 G Needle length: 9 cm and 9 Needle insertion depth: 7 cm Catheter type: closed end flexible Catheter size: 19 Gauge Catheter at skin depth: 14 (12--> 14cm at skin when laid in lateral decub position) cm Test dose: negative  Assessment Events: blood not aspirated, injection not painful, no injection resistance, negative IV test and no paresthesia

## 2016-11-19 NOTE — Progress Notes (Signed)
Post Partum Day #1 Subjective: no complaints, up ad lib, voiding and tolerating PO  Objective: Blood pressure 105/62, pulse 66, temperature 98.4 F (36.9 C), temperature source Oral, resp. rate 16, height 5' 3.5" (1.613 m), weight 207 lb (93.9 kg), last menstrual period 02/19/2016, SpO2 98 %, unknown if currently breastfeeding.  Physical Exam:  General: alert, cooperative and no distress Lochia: appropriate Uterine Fundus: firm Incision: none DVT Evaluation: No evidence of DVT seen on physical exam. No cords or calf tenderness. No significant calf/ankle edema.   Recent Labs  11/18/16 0358  HGB 11.5*  HCT 35.5*    Assessment/Plan: Plan for discharge tomorrow, Breastfeeding and Contraception vasectomy   LOS: 1 day   Morene Crocker, CNM 11/19/2016, 8:05 AM

## 2016-11-19 NOTE — Anesthesia Postprocedure Evaluation (Signed)
Anesthesia Post Note  Patient: Jean Cabrera  Procedure(s) Performed: * No procedures listed *  Patient location during evaluation: Mother Baby Anesthesia Type: Epidural Level of consciousness: awake Pain management: pain level controlled Vital Signs Assessment: post-procedure vital signs reviewed and stable Respiratory status: spontaneous breathing Cardiovascular status: stable Postop Assessment: no headache, no backache, epidural receding and patient able to bend at knees Anesthetic complications: no Comments: Pt states epidural only worked on one side during labor.        Last Vitals:  Vitals:   11/18/16 2230 11/19/16 0700  BP: (!) 110/57 105/62  Pulse: 70 66  Resp: 18 16  Temp: 36.7 C 36.9 C    Last Pain:  Vitals:   11/19/16 0700  TempSrc: Oral  PainSc:    Pain Goal: Patients Stated Pain Goal: 3 (11/18/16 [id number])               Everette Rank

## 2016-11-19 NOTE — Lactation Note (Signed)
This note was copied from a baby's chart. Lactation Consultation Note LC attempted follow up visit at 51 hours of age.  Mom asleep and FOB holding baby.  LC encouraged parents to call RN overnight with breastfeeding questions as needed.  RN reports good feedings and denies concerns about this dyad.   Patient Name: Jean Cabrera [id number] Date: 11/19/2016     Maternal Data    Feeding    LATCH Score/Interventions                      Lactation Tools Discussed/Used     Consult Status      Shoptaw, Justine Null 11/19/2016, 11:28 PM

## 2016-11-20 ENCOUNTER — Inpatient Hospital Stay (HOSPITAL_COMMUNITY): Admission: RE | Admit: 2016-11-20 | Payer: 59 | Source: Ambulatory Visit

## 2016-11-20 DIAGNOSIS — Z3A41 41 weeks gestation of pregnancy: Secondary | ICD-10-CM

## 2016-11-20 DIAGNOSIS — O34211 Maternal care for low transverse scar from previous cesarean delivery: Secondary | ICD-10-CM

## 2016-11-20 DIAGNOSIS — O34219 Maternal care for unspecified type scar from previous cesarean delivery: Secondary | ICD-10-CM

## 2016-11-20 MED ORDER — DOCUSATE SODIUM 100 MG PO CAPS: 100.0000 mg | ORAL_CAPSULE | Freq: Two times a day (BID) | ORAL | 0 refills | Status: DC

## 2016-11-20 MED ORDER — IBUPROFEN 600 MG PO TABS: 600.0000 mg | ORAL_TABLET | Freq: Four times a day (QID) | ORAL | 0 refills | Status: AC

## 2016-11-20 NOTE — Lactation Note (Signed)
This note was copied from a baby's chart. Lactation Consultation Note  Patient Name: Jean Cabrera [id number] Date: 11/20/2016 Reason for consult: Follow-up assessment;Infant weight loss (5% weight loss , Bili check at 33 hours - 7.4 )  Baby is 25 hours old, LC reviewed doc flow sheets and updated doc flow sheets per mom. Mom denies sore nipples , and noted increased swallows. Dad had questions about Jaundice and what is it , LC explained. LC also discussed with mom sore nipple and engorgement prevention. Especially engorgement prevention due to the right breast - inner aspect healing abscess. Mom showed the site to [facility], and removed bandaid. Small amount of clear drainage noted on bandaid. Tissue healing well. LC recommended to mom to consider making it a habit to Rotate between at least 2 breast feeding positions as preventive measures , also to enhance milk supply. Discussed with mom since this she has breast fed several babies , volume can be higher, and if her breast are really full to start express off the 1st breast 1/2 oz so the baby will get to the creamy fatty milk quicker. And then offer the 2nd breast. If the baby only feeds off the 1st breast and doesn't feed the 2nd to not allow the 2nd breast to get over full , release down to comfort . Mom had mentioned her engorgement usually last 1-2 weeks. LC discussed the importance of preventing engorgement by the above recommendations and if she was having problems with engorgement to cal [facility] office, also if the healing abscess started to be a problem.  Per mom has a DEBP - Spectra at home.  Dr. Corinna Lines was into exam baby and after exam LC changed a med- large wet diaper,  Mom latched the baby independently in the modified laid back / cradle / and was able to obtain depth , swallows noted. Mom declined pillow support, and removing the babies clothes.  Mom and dad are ready for D/C as soon as the RN brings in the paperwork. Baby still feeding at 13 mins.  LC stressed the importance of watching for non - nutritive feeding patterns and hanging Out at the breast.  Mother informed of post-discharge support and given phone number to the lactation department, including services for phone call assistance; out-patient appointments; and breastfeeding support group. List of other breastfeeding resources in the community given in the handout. Encouraged mother to call for problems or concerns related to breastfeeding.   Maternal Data    Feeding Feeding Type: Breast Fed Length of feed: 30 min (per mom )  LATCH Score/Interventions Latch: Grasps breast easily, tongue down, lips flanged, rhythmical sucking. Intervention(s): Skin to skin;Teach feeding cues;Waking techniques Intervention(s): Adjust position;Assist with latch;Breast massage;Breast compression  Audible Swallowing: Spontaneous and intermittent  Type of Nipple: Everted at rest and after stimulation  Comfort (Breast/Nipple): Soft / non-tender     Hold (Positioning): No assistance needed to correctly position infant at breast. Intervention(s): Breastfeeding basics reviewed;Support Pillows;Position options;Skin to skin  LATCH Score: 10  Lactation Tools Discussed/Used WIC Program: No   Consult Status Consult Status: Complete Date: 11/20/16    Myer Haff 11/20/2016, 9:25 AM

## 2016-11-20 NOTE — Discharge Summary (Signed)
OB Discharge Summary     Patient Name: Jean Cabrera DOB: [birth year] MRN: [id number]  Date of admission: 11/18/2016 Delivering MD: Marcille Buffy D   Date of discharge: 11/20/2016  Admitting diagnosis: 41.2 weeks in labor Intrauterine pregnancy: [redacted]w[redacted]d     Secondary diagnosis:  Principal Problem:   VBAC (vaginal birth after Cesarean) Active Problems:   Indication for care in labor or delivery  Additional problems: None     Discharge diagnosis: Term Pregnancy Delivered and VBAC                                                                                                Post partum procedures:None  Augmentation: AROM and Pitocin  Complications: None  Hospital course:  Onset of Labor With Vaginal Delivery     36 y.o. yo [id number] at [redacted]w[redacted]d was admitted in Active Labor on 11/18/2016. Patient had an uncomplicated labor course as follows:  Membrane Rupture Time/Date: 9:09 AM ,11/18/2016   Intrapartum Procedures: Episiotomy: None [1]                                         Lacerations:  None [1]  Patient had a delivery of a Viable infant with a successful VBAC. 11/18/2016  Information for the patient's newborn:  Kimmarie, Brieske [id number]  Delivery Method: Vag-Spont    Pateint had an uncomplicated postpartum course.  She is ambulating, tolerating a regular diet, passing flatus, and urinating well. Patient is discharged home in stable condition on 11/20/16.   Physical exam  Vitals:   11/18/16 2230 11/19/16 0700 11/19/16 1800 11/20/16 0511  BP: (!) 110/57 105/62 111/62 123/70  Pulse: 70 66 65 71  Resp: 18 16 18 18   Temp: 98.1 F (36.7 C) 98.4 F (36.9 C) 97.6 F (36.4 C) 97.6 F (36.4 C)  TempSrc: Oral Oral Oral Oral  SpO2: 98%     Weight:      Height:       General: alert, cooperative and no distress Lochia: appropriate Uterine Fundus: firm Incision: N/A DVT Evaluation: No evidence of DVT seen on physical exam. Negative Homan's sign. No cords or calf  tenderness. Labs: Lab Results  Component Value Date   WBC 6.3 11/18/2016   HGB 11.5 (L) 11/18/2016   HCT 35.5 (L) 11/18/2016   MCV 79.1 11/18/2016   PLT 359 11/18/2016   CMP Latest Ref Rng & Units 10/24/2016  Glucose 65 - 99 mg/dL 79  BUN 6 - 20 mg/dL 6  Creatinine 0.44 - 1.00 mg/dL 0.56  Sodium 135 - 145 mmol/L 136  Potassium 3.5 - 5.1 mmol/L 3.8  Chloride 101 - 111 mmol/L 103  CO2 22 - 32 mmol/L 24  Calcium 8.9 - 10.3 mg/dL 9.3  Total Protein 6.5 - 8.1 g/dL 6.8  Total Bilirubin 0.3 - 1.2 mg/dL 0.6  Alkaline Phos 38 - 126 U/L 190(H)  AST 15 - 41 U/L 59(H)  ALT 14 - 54 U/L 32    Discharge instruction: per After  Visit Summary and "Baby and Me Booklet".  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     Keflex [cephalexin] Nausea And Vomiting   Lightheadedness    Latex Itching      Medication List    STOP taking these medications   ampicillin 500 MG capsule Commonly known as:  PRINCIPEN     TAKE these medications   docusate sodium 100 MG capsule Commonly known as:  COLACE Take 1 capsule (100 mg total) by mouth 2 (two) times daily.   ibuprofen 600 MG tablet Commonly known as:  ADVIL,MOTRIN Take 1 tablet (600 mg total) by mouth every 6 (six) hours.   prenatal multivitamin Tabs tablet Take 1 tablet by mouth daily at 12 noon.       Diet: routine diet  Activity: Advance as tolerated. Pelvic rest for 6 weeks.   Outpatient follow up:6 weeks Follow up Appt:No future appointments. Follow up Visit:No Follow-up on file.  Postpartum contraception: Vasectomy  Newborn Data: Live born female  Birth Weight: 8 lb 9 oz (3884 g) APGAR: 5, 8  Baby Feeding: Breast Disposition:home with mother   11/20/2016 Katherine Basset, DO OB Fellow

## 2016-11-20 NOTE — Discharge Instructions (Signed)

## 2016-11-20 NOTE — Plan of Care (Signed)
Problem: Coping: Goal: Ability to cope will improve Discharge education reviewed with patient and significant other. Patient and significant other are comfortable with postpartum period and baby care since this is their fourth child.

## 2018-05-15 IMAGING — US US MFM FETAL BPP W/O NON-STRESS
1 series · 15 of 22 positions shown · non-contrast
Comparison: none

[Series 1: us mfm fetal bpp w/o non-stress · 22 acquisitions, 15 frames shown]
[im 1/22]
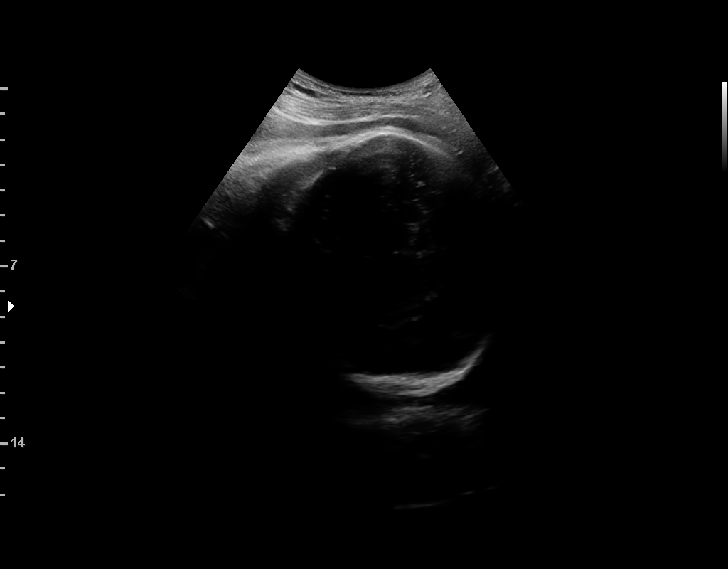
[im 3/22]
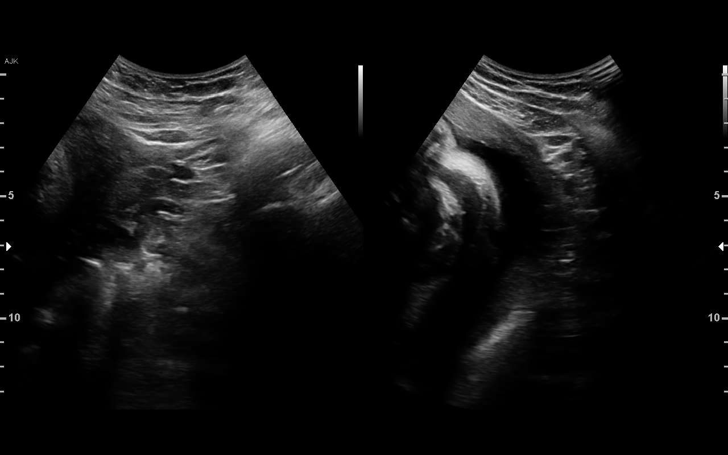
[im 4/22]
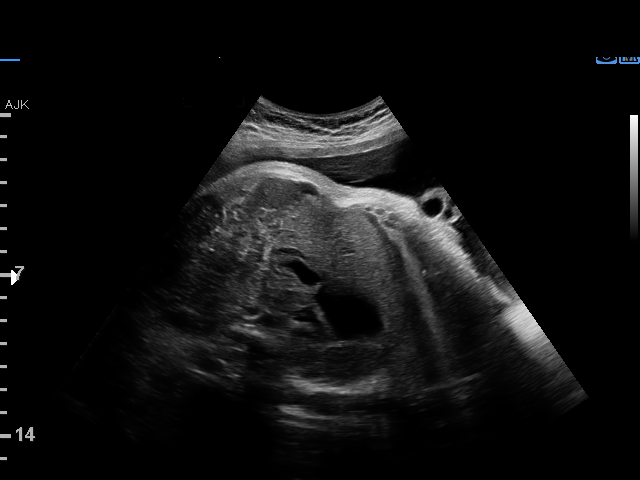
[im 6/22]
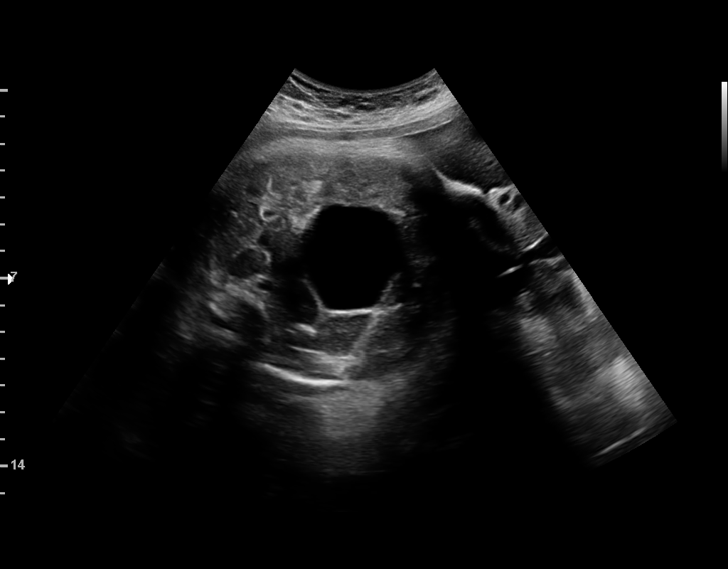
[im 7/22]
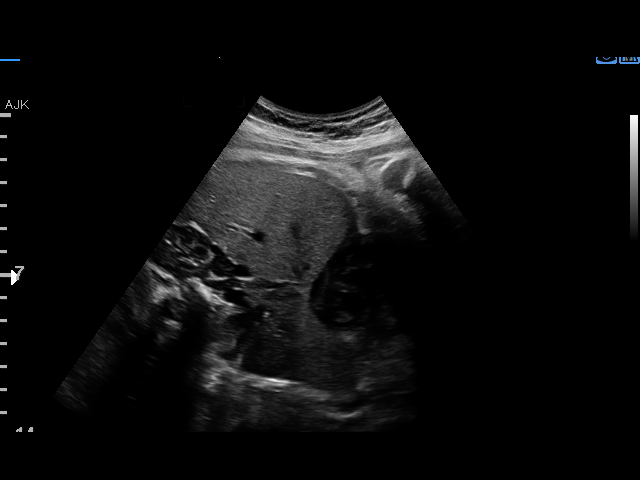
[im 9/22]
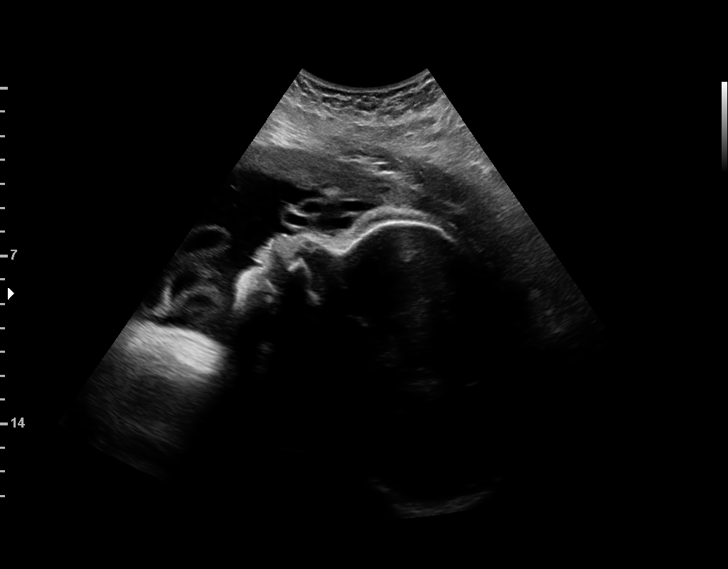
[im 10/22]
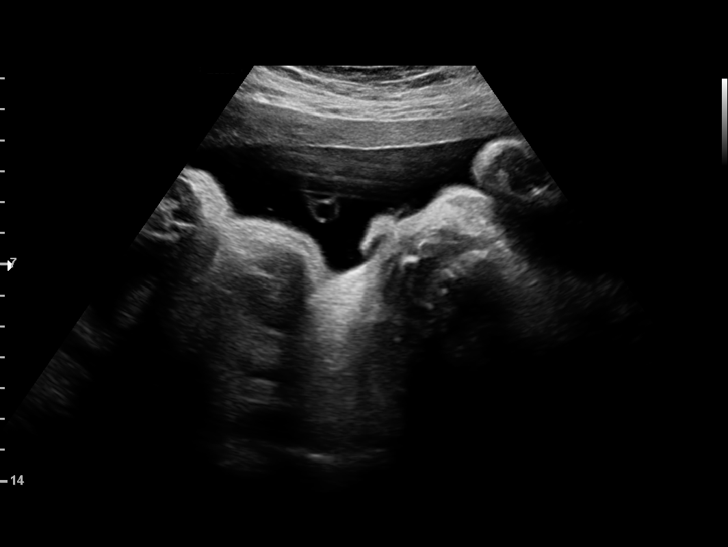
[im 12/22]
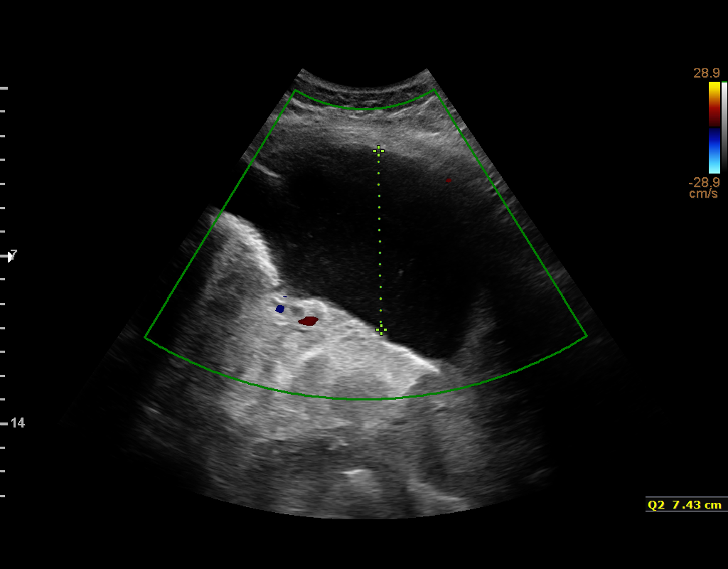
[im 13/22]
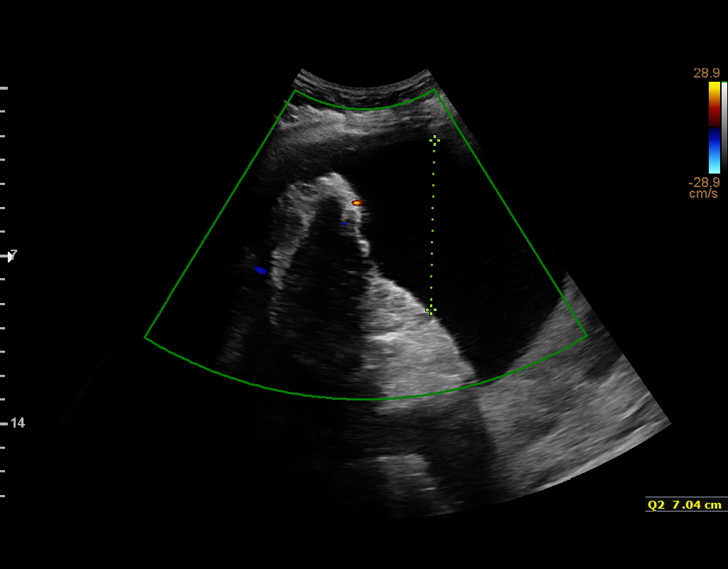
[im 14/22]
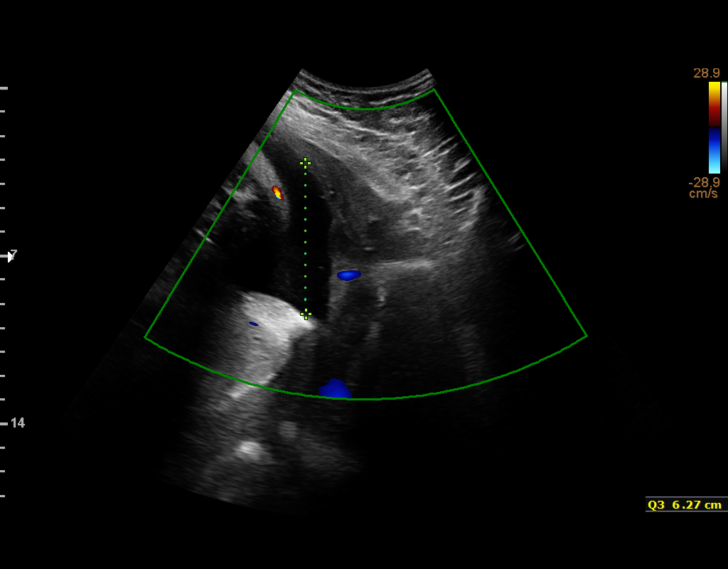
[im 16/22]
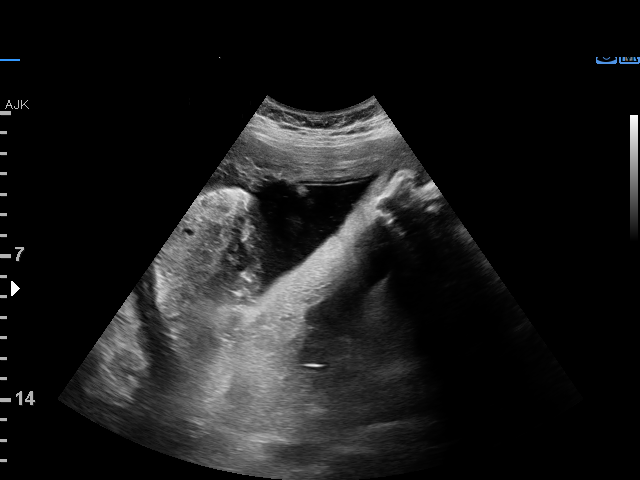
[im 17/22]
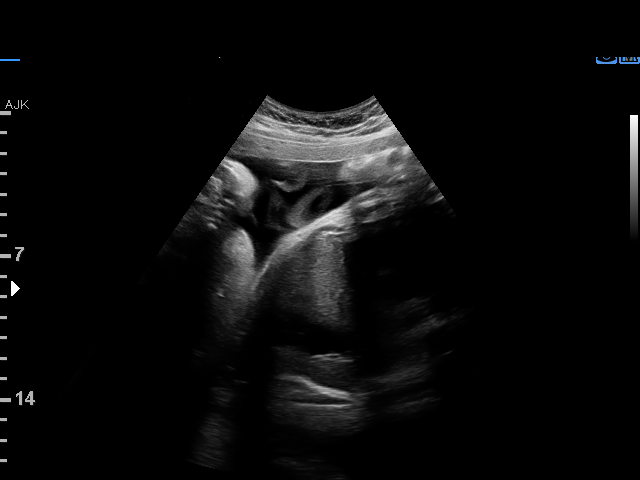
[im 19/22]
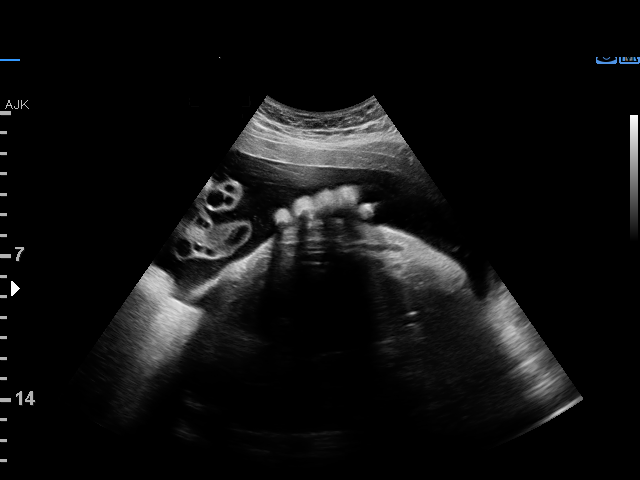
[im 20/22]
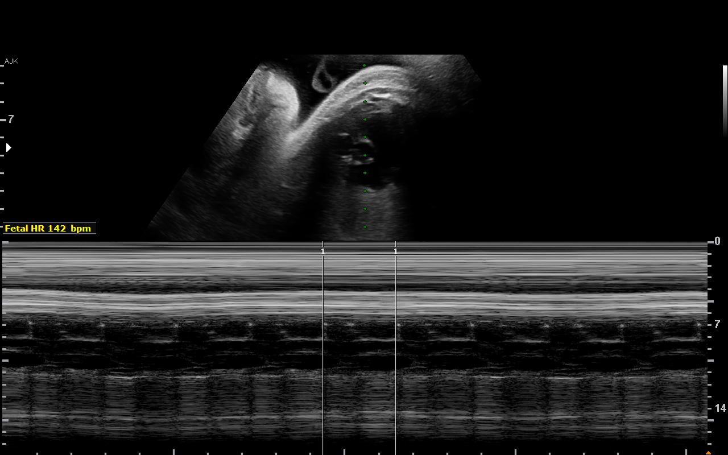
[im 22/22]
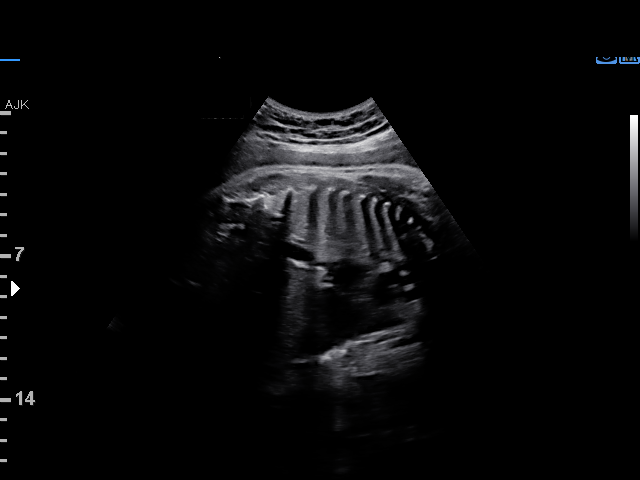

[15 of 22 positions shown; findings below may reference images not displayed]

KLPIGBB DO

1  SACRI ANDA             [id number]      [id number]     [id number]
Indications

40 weeks gestation of pregnancy
Postdate pregnancy (40-42 weeks)
History of cesarean delivery, currently
pregnant
Uterine fibroids affecting pregnancy in third  O34.13,
trimester, antepartum
OB History

Gravidity:    5         Term:   3        Prem:   0        SAB:   0
TOP:          1       Ectopic:  0
Fetal Evaluation

Num Of Fetuses:     1
Fetal Heart         142
Rate(bpm):
Cardiac Activity:   Observed
Presentation:       Cephalic
Placenta:           Posterior, above cervical os

Amniotic Fluid
AFI FV:      Subjectively within normal limits

AFI Sum(cm)     %Tile       Largest Pocket(cm)
21.43           97

RUQ(cm)       RLQ(cm)       LUQ(cm)        LLQ(cm)
5.47
Biophysical Evaluation
Amniotic F.V:   Within normal limits       F. Tone:        Observed
F. Movement:    Observed                   Score:          [DATE]
F. Breathing:   Observed
Gestational Age

Best:          40w 6d    Det. By:   Previous Ultrasound      EDD:   11/09/16
(05/23/16)
Impression

IUP at 40+6. Postdates
Normal fetal cardiac activity and movement
Normal amniotic fluid
BPP [DATE]
Recommendations

Follow-up ultrasounds as clinically indicated.

## 2023-04-11 ENCOUNTER — Ambulatory Visit (HOSPITAL_COMMUNITY)
Admission: EM | Admit: 2023-04-11 | Discharge: 2023-04-11 | Disposition: A | Discharge status: Home / Self Care | Payer: BC Managed Care – PPO | Attending: Internal Medicine | Admitting: Internal Medicine

## 2023-04-11 ENCOUNTER — Ambulatory Visit (INDEPENDENT_AMBULATORY_CARE_PROVIDER_SITE_OTHER): Payer: BC Managed Care – PPO

## 2023-04-11 ENCOUNTER — Encounter (HOSPITAL_COMMUNITY): Payer: Self-pay

## 2023-04-11 DIAGNOSIS — S93492A Sprain of other ligament of left ankle, initial encounter: Secondary | ICD-10-CM

## 2023-04-11 MED ORDER — IBUPROFEN 800 MG PO TABS
800.0000 mg | ORAL_TABLET | Freq: Once | ORAL | Status: AC
  Administered 2023-04-11: 800 mg via ORAL

## 2023-04-11 MED ORDER — IBUPROFEN 800 MG PO TABS
ORAL_TABLET | ORAL | Status: AC
  Filled 2023-04-11: qty 1

## 2023-04-11 NOTE — ED Triage Notes (Signed)
Patient states she stepped into a hole in her backyard yesterday and rolled her left ankle. Patient has swelling and pain.  Patient presented with an ace wrap. Patient states she took an aleve last night and pain cream.

## 2023-04-11 NOTE — ED Provider Notes (Signed)
[facility]    CSN: [id number] Arrival date & time: 04/11/23  0957      History   Chief Complaint Chief Complaint  Patient presents with   Ankle Injury    HPI Jean Cabrera is a 42 y.o. female.   Patient presents to clinic for left ankle pain and swelling after injury last night. She was outside in the yard and stepped in a hole and twisted her ankle inward. She wrapped her ankle with an ace wrap and took an Aleeve last night.  She was able to walk initially after the injury.  Placing some weight on her ankle seems to be okay, has not tried to ambulate.  Ankle is swollen and tender at the lateral side.  Reports a previous sprain to one of her ankles a few years back with hairline fx, unsure if right or left.  The history is provided by the patient and medical records.  Ankle Injury    Past Medical History:  Diagnosis Date   Abnormal Pap smear    Anemia    Late prenatal care    13 weeks   Myringotomy tube status    Uterine fibroids affecting pregnancy     Patient Active Problem List   Diagnosis Date Noted   VBAC (vaginal birth after Cesarean) 11/20/2016   Indication for care in labor or delivery 11/18/2016   Pregnant and not yet delivered 11/07/2016   Uterine contractions during pregnancy 11/07/2016   Observation and evaluation of newborn for suspected infectious condition 04/11/2014   Fever in newborn 04/11/2014   Potential for hyperbilirubinemia in newborn 04/11/2014   Cesarean delivery delivered 04/09/2014   Post term pregnancy, antepartum condition or complication 04/08/2014    Past Surgical History:  Procedure Laterality Date   CESAREAN SECTION N/A 04/09/2014   Procedure: CESAREAN SECTION;  Surgeon: Freddrick March. Tenny Craw, MD;  Location: [facility];  Service: Obstetrics;  Laterality: N/A;   COLPOSCOPY     DILATION AND CURETTAGE OF UTERUS     tubes in ear      OB History     Gravida  5   Para  4   Term  4   Preterm      AB  1   Living  4       SAB  0   IAB  1   Ectopic      Multiple  1   Live Births  4            Home Medications    Prior to Admission medications   Medication Sig Start Date End Date Taking? Authorizing Provider  ibuprofen (ADVIL,MOTRIN) 600 MG tablet Take 1 tablet (600 mg total) by mouth every 6 (six) hours. 11/20/16   Mumaw, Hiram Comber, DO    Family History Family History  Problem Relation Age of Onset   Alcohol abuse Father    Drug abuse Father    Thyroid disease Paternal Aunt    COPD Paternal Aunt        thyroid cancer   Alcohol abuse Paternal Aunt    Drug abuse Paternal Aunt    Mental illness Cousin        bipolar    Social History Social History   Tobacco Use   Smoking status: Never   Smokeless tobacco: Never  Vaping Use   Vaping Use: Never used  Substance Use Topics   Alcohol use: No   Drug use: No     Allergies  Food, Keflex [cephalexin], and Latex   Review of Systems Review of Systems  Musculoskeletal:  Positive for gait problem and joint swelling.     Physical Exam Triage Vital Signs ED Triage Vitals  Enc Vitals Group     BP 04/11/23 1009 123/71     Pulse Rate 04/11/23 1009 81     Resp 04/11/23 1009 16     Temp 04/11/23 1009 98.4 F (36.9 C)     Temp Source 04/11/23 1009 Oral     SpO2 04/11/23 1009 94 %     Weight --      Height --      Head Circumference --      Peak Flow --      Pain Score 04/11/23 1011 2     Pain Loc --      Pain Edu? --      Excl. in GC? --    No data found.  Updated Vital Signs BP 123/71 (BP Location: Right Arm)   Pulse 81   Temp 98.4 F (36.9 C) (Oral)   Resp 16   LMP 04/06/2023 (Approximate)   SpO2 94%   Visual Acuity Right Eye Distance:   Left Eye Distance:   Bilateral Distance:    Right Eye Near:   Left Eye Near:    Bilateral Near:     Physical Exam Vitals and nursing note reviewed.  Constitutional:      Appearance: Normal appearance.  HENT:     Head: Normocephalic and atraumatic.     Right  Ear: External ear normal.     Left Ear: External ear normal.     Nose: Nose normal.     Mouth/Throat:     Mouth: Mucous membranes are moist.  Eyes:     Conjunctiva/sclera: Conjunctivae normal.  Cardiovascular:     Rate and Rhythm: Normal rate.     Pulses: Normal pulses.          Dorsalis pedis pulses are 2+ on the left side.       Posterior tibial pulses are 2+ on the left side.  Pulmonary:     Effort: Pulmonary effort is normal.  Musculoskeletal:        General: Swelling, tenderness and signs of injury present.     Left foot: Decreased range of motion.       Feet:  Feet:     Left foot:     Skin integrity: Skin integrity normal.     Comments: Diffuse soft tissue swelling and TTP along lateral malleolus and talus.  Skin:    General: Skin is warm and dry.     Capillary Refill: Capillary refill takes less than 2 seconds.  Neurological:     General: No focal deficit present.     Mental Status: She is alert and oriented to person, place, and time.  Psychiatric:        Mood and Affect: Mood normal.        Behavior: Behavior is cooperative.      UC Treatments / Results  Labs (all labs ordered are listed, but only abnormal results are displayed) Labs Reviewed - No data to display  EKG   Radiology DG Ankle Complete Left  Result Date: 04/11/2023 CLINICAL DATA:  Left ankle injury. EXAM: LEFT ANKLE COMPLETE - 3+ VIEW COMPARISON:  None Available. FINDINGS: Mild distal medial malleolar degenerative spurring. The ankle mortise is symmetric and intact. Small plantar calcaneal heel spur. Minimal dorsal tarsometatarsal degenerative spurring on lateral view.  No acute fracture or dislocation. Mild-to-moderate lateral malleolar soft tissue swelling. IMPRESSION: Mild-to-moderate lateral malleolar soft tissue swelling. No acute fracture. Electronically Signed   By: Neita Garnet M.D.   On: 04/11/2023 11:03    Procedures Procedures (including critical care time)  Medications Ordered in  UC Medications  ibuprofen (ADVIL) tablet 800 mg (has no administration in time range)    Initial Impression / Assessment and Plan / UC Course  I have reviewed the triage vital signs and the nursing notes.  Pertinent labs & imaging results that were available during my care of the patient were reviewed by me and considered in my medical decision making (see chart for details).  Vitals and triage reviewed, patient is hemodynamically stable.  Ankle imaging negative for acute fracture or dislocation.  TTP of lateral ankle.  Pulses and sensation intact.  Range of motion limited due to pain.  Swelling and tenderness to lateral ankle, suspect anterior talofibular ligament sprain.  Ankle brace and ibuprofen provided in clinic, discussed RICE and orthopedic follow-up.  Plan of care, follow-up care and return precautions given, no questions at this time.     Final Clinical Impressions(s) / UC Diagnoses   Final diagnoses:  Sprain of anterior talofibular ligament of left ankle, initial encounter     Discharge Instructions      Your imaging was negative for acute fracture or dislocation.  We have provided you with a ankle brace in clinic, please rest, ice, compress and elevate your ankle.  You can take 800 mg of ibuprofen every 8 hours for pain and inflammation.  You can bear weight as tolerated with the support of the brace.  If your pain persist beyond the next 1 to 2 weeks with no improvement, please follow-up with [place] [facility] for re-evaluation.  Please seek immediate care if you develop numbness, tingling, sudden worsening of pain, or any new concerning symptoms.     ED Prescriptions   None    PDMP not reviewed this encounter.   Tyronne Blann, [place], [place] 04/11/23 [phone]

## 2023-04-11 NOTE — Discharge Instructions (Addendum)
Your imaging was negative for acute fracture or dislocation.  We have provided you with a ankle brace in clinic, please rest, ice, compress and elevate your ankle.  You can take 800 mg of ibuprofen every 8 hours for pain and inflammation.  You can bear weight as tolerated with the support of the brace.  If your pain persist beyond the next 1 to 2 weeks with no improvement, please follow-up with [place] [facility] for re-evaluation.  Please seek immediate care if you develop numbness, tingling, sudden worsening of pain, or any new concerning symptoms.
